# Patient Record
Sex: Female | Born: 2000
Health system: Southern US, Community
[De-identification: ages and names within clinical notes are randomized; demographics above are authoritative.]

## PROBLEM LIST (undated history)

## (undated) DIAGNOSIS — D680 Von Willebrand's disease: Secondary | ICD-10-CM

## (undated) DIAGNOSIS — D6801 Von willebrand disease, type 1: Secondary | ICD-10-CM

## (undated) DIAGNOSIS — R197 Diarrhea, unspecified: Secondary | ICD-10-CM

## (undated) HISTORY — PX: SKIN TAG REMOVAL: SHX780

## (undated) HISTORY — DX: Diarrhea, unspecified: R19.7

## (undated) HISTORY — PX: WISDOM TOOTH EXTRACTION: SHX21

## (undated) HISTORY — DX: Von willebrand disease, type 1: D68.01

## (undated) HISTORY — DX: Von Willebrand's disease: D68.0

---
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
## 2022-02-15 ENCOUNTER — Institutional Professional Consult (permissible substitution): Payer: Self-pay | Admitting: Plastic Surgery

## 2022-02-19 ENCOUNTER — Encounter: Payer: Self-pay | Admitting: Plastic Surgery

## 2022-02-19 ENCOUNTER — Ambulatory Visit (INDEPENDENT_AMBULATORY_CARE_PROVIDER_SITE_OTHER): Payer: 59 | Admitting: Plastic Surgery

## 2022-02-19 DIAGNOSIS — G8929 Other chronic pain: Secondary | ICD-10-CM

## 2022-02-19 DIAGNOSIS — M546 Pain in thoracic spine: Secondary | ICD-10-CM

## 2022-02-19 DIAGNOSIS — Z6835 Body mass index (BMI) 35.0-35.9, adult: Secondary | ICD-10-CM

## 2022-02-19 DIAGNOSIS — M545 Low back pain, unspecified: Secondary | ICD-10-CM

## 2022-02-19 DIAGNOSIS — M542 Cervicalgia: Secondary | ICD-10-CM | POA: Diagnosis not present

## 2022-02-19 DIAGNOSIS — M549 Dorsalgia, unspecified: Secondary | ICD-10-CM | POA: Insufficient documentation

## 2022-02-19 DIAGNOSIS — Z803 Family history of malignant neoplasm of breast: Secondary | ICD-10-CM

## 2022-02-19 DIAGNOSIS — N62 Hypertrophy of breast: Secondary | ICD-10-CM | POA: Diagnosis not present

## 2022-02-19 NOTE — Progress Notes (Signed)
Patient ID: Cynthia Gamble, female    DOB: Jul 02, 2000, 21 y.o.   MRN: [id number]   No chief complaint on file.   Mammary Hyperplasia: The patient is a 21 y.o. female with a history of mammary hyperplasia for several years.  She has extremely large breasts causing symptoms that include the following: Back pain in the upper and lower back, including neck pain. She pulls or pins her bra straps to provide better lift and relief of the pressure and pain. She notices relief by holding her breast up manually.  Her shoulder straps cause grooves and pain and pressure that requires padding for relief. Pain medication is sometimes required with motrin and tylenol.  Activities that are hindered by enlarged breasts include: exercise and running.  She has tried supportive clothing as well as fitted bras without improvement.  Her breasts are extremely large and fairly symmetric.  She has hyperpigmentation of the inframammary area on both sides.  The sternal to nipple distance on the right is 33 cm and the left is 35 cm.  The IMF distance is 15 cm.  She is 4 feet 9 inches tall and weighs 162 pounds.  The BMI = 35.1 kg/m.  Preoperative bra size = H cup. She would like to be proportionate. The estimated excess breast tissue to be removed at the time of surgery = 350-375 grams on the left and 350-375 grams on the right.  Mammogram history: none.  Family history of breast cancer:  paternal aunt.  Tobacco use:  none.   The patient expresses the desire to pursue surgical intervention.     Review of Systems  Constitutional:  Positive for activity change. Negative for appetite change.  Eyes: Negative.   Respiratory: Negative.  Negative for chest tightness and shortness of breath.   Cardiovascular: Negative.   Gastrointestinal: Negative.   Endocrine: Negative.   Genitourinary: Negative.   Musculoskeletal:  Positive for back pain and neck pain.  Skin: Negative.   Hematological: Negative.    Psychiatric/Behavioral: Negative.      Past Medical History:  Diagnosis Date   Diarrhea    Von Willebrand disease type IA (HCC)     Past Surgical History:  Procedure Laterality Date   SKIN TAG REMOVAL     WISDOM TOOTH EXTRACTION        Current Outpatient Medications:    Cholecalciferol 25 MCG (1000 UT) tablet, Take by mouth., Disp: , Rfl:    JUNEL FE 24 1-20 MG-MCG(24) tablet, Take 1 tablet by mouth daily., Disp: , Rfl:    Objective:   Vitals:   02/19/22 1027  BP: 106/66  Pulse: 86  SpO2: 99%    Physical Exam Vitals and nursing note reviewed.  Constitutional:      Appearance: Normal appearance.  HENT:     Head: Normocephalic and atraumatic.  Cardiovascular:     Rate and Rhythm: Normal rate.     Pulses: Normal pulses.  Pulmonary:     Effort: Pulmonary effort is normal.  Abdominal:     General: There is no distension.     Palpations: Abdomen is soft.     Tenderness: There is no abdominal tenderness.  Musculoskeletal:        General: No swelling or deformity.  Skin:    General: Skin is warm.     Capillary Refill: Capillary refill takes less than 2 seconds.     Coloration: Skin is not jaundiced.     Findings: No bruising or lesion.  Neurological:     Mental Status: She is alert and oriented to person, place, and time.  Psychiatric:        Mood and Affect: Mood normal.        Behavior: Behavior normal.        Thought Content: Thought content normal.        Judgment: Judgment normal.     Assessment & Plan:  Symptomatic mammary hypertrophy  Chronic bilateral thoracic back pain  Neck pain  The procedure the patient selected and that was best for the patient was discussed. The risk were discussed and include but not limited to the following:  Breast asymmetry, fluid accumulation, firmness of the breast, inability to breast feed, loss of nipple or areola, skin loss, change in skin and nipple sensation, fat necrosis of the breast tissue, bleeding, infection  and healing delay.  There are risks of anesthesia and injury to nerves or blood vessels.  Allergic reaction to tape, suture and skin glue are possible.  There will be swelling.  Any of these can lead to the need for revisional surgery.  A breast reduction has potential to interfere with diagnostic procedures in the future.  This procedure is best done when the breast is fully developed.  Changes in the breast will continue to occur over time: pregnancy, weight gain or weigh loss.    Total time: 40 minutes. This includes time spent with the patient during the visit as well as time spent before and after the visit reviewing the chart, documenting the encounter, ordering pertinent studies and literature for the patient.    The patient is a good candidate for bilateral breast reduction.  I will go ahead and submit the request for preauthorization.  Pictures were obtained of the patient and placed in the chart with the patient's or guardian's permission.  Alena Bills Shady Padron, DO

## 2022-03-18 ENCOUNTER — Telehealth: Payer: Self-pay | Admitting: *Deleted

## 2022-03-18 NOTE — Telephone Encounter (Signed)
Called patient to schedule surgery, she was in class and will call me back.

## 2022-06-03 NOTE — Progress Notes (Unsigned)
Patient ID: Cynthia Gamble, female    DOB: 02/11/2001, 21 y.o.   MRN: [id number]  No chief complaint on file.     ICD-10-CM   1. Symptomatic mammary hypertrophy  N62     2. Chronic bilateral thoracic back pain  M54.6    G89.29     3. Neck pain  M54.2        History of Present Illness: Cynthia Gamble is a 21 y.o.  female  with a history of macromastia.  She presents for preoperative evaluation for upcoming procedure, Bilateral Breast Reduction, scheduled for 06/12/2022 with Dr.  Ulice Bold  The patient {HAS HAS [id number]} had problems with anesthesia. ***  Summary of Previous Visit: STN is 33 cm on the right and 35 cm on the left.  BMI is 35.  Preoperative bra size equals H cup.  No mammographic history.  No tobacco use.  Estimated excess breast tissue to be removed at time of surgery: 350-375 grams  Job: ***  PMH Significant for: ***   Past Medical History: Allergies: No Known Allergies  Current Medications:  Current Outpatient Medications:    Cholecalciferol 25 MCG (1000 UT) tablet, Take by mouth., Disp: , Rfl:    JUNEL FE 24 1-20 MG-MCG(24) tablet, Take 1 tablet by mouth daily., Disp: , Rfl:   Past Medical Problems: Past Medical History:  Diagnosis Date   Diarrhea    Von Willebrand disease type IA (HCC)     Past Surgical History: Past Surgical History:  Procedure Laterality Date   SKIN TAG REMOVAL     WISDOM TOOTH EXTRACTION      Social History: Social History   Socioeconomic History   Marital status: Single    Spouse name: Not on file   Number of children: Not on file   Years of education: Not on file   Highest education level: Not on file  Occupational History   Not on file  Tobacco Use   Smoking status: Never   Smokeless tobacco: Never  Substance and Sexual Activity   Alcohol use: Not on file   Drug use: Not on file   Sexual activity: Not on file  Other Topics Concern   Not on file  Social History Narrative   Zell is  in the 10th grade at [employer]   Lives with mom, stepdad, 2 brothers.   Social Determinants of Health   Financial Resource Strain: Not on file  Food Insecurity: Not on file  Transportation Needs: Not on file  Physical Activity: Not on file  Stress: Not on file  Social Connections: Not on file  Intimate Partner Violence: Not on file    Family History: Family History  Problem Relation Age of Onset   Irritable bowel syndrome Maternal Grandmother    Lactose intolerance Maternal Grandmother    Hypertension Maternal Grandmother    Short stature Maternal Grandmother        4'10   Migraines Maternal Grandmother    Depression Maternal Grandmother    Anxiety disorder Maternal Grandmother    Cancer Paternal Grandmother    Migraines Mother    Depression Mother    Anxiety disorder Mother    Bipolar disorder Mother    Short stature Maternal Aunt        great aunt- 4'9"   Migraines Maternal Aunt    Depression Maternal Aunt    Anxiety disorder Maternal Aunt    Autism Cousin    Seizures Neg Hx    Schizophrenia Neg  Hx    ADD / ADHD Neg Hx     Review of Systems: ROS  Physical Exam: Vital Signs There were no vitals taken for this visit.  Physical Exam *** Constitutional:      General: Not in acute distress.    Appearance: Normal appearance. Not ill-appearing.  HENT:     Head: Normocephalic and atraumatic.  Eyes:     Pupils: Pupils are equal, round Neck:     Musculoskeletal: Normal range of motion.  Cardiovascular:     Rate and Rhythm: Normal rate    Pulses: Normal pulses.  Pulmonary:     Effort: Pulmonary effort is normal. No respiratory distress.  Musculoskeletal: Normal range of motion.  Skin:    General: Skin is warm and dry.     Findings: No erythema or rash.  Neurological:     General: No focal deficit present.     Mental Status: Alert and oriented to person, place, and time. Mental status is at baseline.     Motor: No weakness.  Psychiatric:        Mood  and Affect: Mood normal.        Behavior: Behavior normal.    Assessment/Plan: The patient is scheduled for bilateral breast reduction with Dr. Ulice Bold.  Risks, benefits, and alternatives of procedure discussed, questions answered and consent obtained.    Smoking Status: ***; Counseling Given? *** Last Mammogram: N/A due to age  Caprini Score: ***; Risk Factors include: ***, BMI *** 25, and length of planned surgery. Recommendation for mechanical *** pharmacological prophylaxis. Encourage early ambulation.   Pictures obtained: @consult   Post-op Rx sent to pharmacy: {Blank:[id number]::"Oxycodone, Zofran, Keflex","Oxycodone, Zofran"}  Patient was provided with the breast reduction and General Surgical Risk consent document and Pain Medication Agreement prior to their appointment.  They had adequate time to read through the risk consent documents and Pain Medication Agreement. We also discussed them in person together during this preop appointment. All of their questions were answered to their satisfaction.  Recommended calling if they have any further questions.  Risk consent form and Pain Medication Agreement to be scanned into patient's chart.  The risk that can be encountered with breast reduction were discussed and include the following but not limited to these:  Breast asymmetry, fluid accumulation, firmness of the breast, inability to breast feed, loss of nipple or areola, skin loss, decrease or no nipple sensation, fat necrosis of the breast tissue, bleeding, infection, healing delay.  There are risks of anesthesia, changes to skin sensation and injury to nerves or blood vessels.  The muscle can be temporarily or permanently injured.  You may have an allergic reaction to tape, suture, glue, blood products which can result in skin discoloration, swelling, pain, skin lesions, poor healing.  Any of these can lead to the need for revisonal surgery or stage procedures.  A reduction has potential to  interfere with diagnostic procedures.  Nipple or breast piercing can increase risks of infection.  This procedure is best done when the breast is fully developed.  Changes in the breast will continue to occur over time.  Pregnancy can alter the outcomes of previous breast reduction surgery, weight gain and weigh loss can also effect the long term appearance.     Electronically signed by: Kermit Balo Abishai Viegas, PA-C 06/03/2022 2:08 PM

## 2022-06-03 NOTE — H&P (View-Only) (Signed)
Patient ID: Cynthia Gamble, female    DOB: 09-23-2000, 21 y.o.   MRN: [id number]  Chief Complaint  Patient presents with   Pre-op Exam      ICD-10-CM   1. Symptomatic mammary hypertrophy  N62     2. Chronic bilateral thoracic back pain  M54.6    G89.29     3. Neck pain  M54.2        History of Present Illness: Cynthia Gamble is a 21 y.o.  female  with a history of macromastia.  She presents for preoperative evaluation for upcoming procedure, Bilateral Breast Reduction, scheduled for 06/12/2022 with Dr.  Ulice Bold. She presents today with her mother. Mom has a hx of breast reduction as well.  The patient has not had problems with anesthesia. No history of DVT/PE.  No family history of DVT/PE.  No family or personal history of bleeding or clotting disorders.  Patient is not currently taking any blood thinners.  No history of CVA/MI.   Summary of Previous Visit: STN is 33 cm on the right and 35 cm on the left.  BMI is 35.  Preoperative bra size equals H cup.  No mammographic history.  No tobacco use.  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  PMH Significant for: Patient is on OCP. No other medical hx  Addendum: Patient with concerns for VWD 1A, saw hematology for consult August 2023. Patient had repeat labs earlier this year (August). Hematologist, Dr. Trinna Balloon at [facility], "I see no evidence of a primary or secondary hemostatic disorder based on the work-up done today. I see no hematologic contraindication to proceeding with her elective surgery."  She would like to be a C cup, if possible  Past Medical History: Allergies: No Known Allergies  Current Medications:  Current Outpatient Medications:    Cholecalciferol 25 MCG (1000 UT) tablet, Take by mouth., Disp: , Rfl:    JUNEL FE 24 1-20 MG-MCG(24)  tablet, Take 1 tablet by mouth daily., Disp: , Rfl:   Past Medical Problems: Past Medical History:  Diagnosis Date   Diarrhea    Von Willebrand disease type IA (HCC)     Past Surgical History: Past Surgical History:  Procedure Laterality Date   SKIN TAG REMOVAL     WISDOM TOOTH EXTRACTION      Social History: Social History   Socioeconomic History   Marital status: Single    Spouse name: Not on file   Number of children: Not on file   Years of education: Not on file   Highest education level: Not on file  Occupational History   Not on file  Tobacco Use   Smoking status: Never   Smokeless tobacco: Never  Substance and Sexual Activity   Alcohol use: Not on file   Drug use: Not on file   Sexual activity: Not on file  Other Topics Concern   Not on file  Social History Narrative   Cynthia Gamble is in the 10th grade at [employer]   Lives with mom, stepdad, 2 brothers.   Social Determinants of Health   Financial Resource Strain: Not on file  Food Insecurity: Not on file  Transportation Needs: Not on file  Physical Activity: Not on file  Stress: Not on file  Social Connections: Not on file  Intimate Partner Violence: Not on  file    Family History: Family History  Problem Relation Age of Onset   Irritable bowel syndrome Maternal Grandmother    Lactose intolerance Maternal Grandmother    Hypertension Maternal Grandmother    Short stature Maternal Grandmother        4'10   Migraines Maternal Grandmother    Depression Maternal Grandmother    Anxiety disorder Maternal Grandmother    Cancer Paternal Grandmother    Migraines Mother    Depression Mother    Anxiety disorder Mother    Bipolar disorder Mother    Short stature Maternal Aunt        great aunt- 4'9"   Migraines Maternal Aunt    Depression Maternal Aunt    Anxiety disorder Maternal Aunt    Autism Cousin    Seizures Neg Hx    Schizophrenia Neg Hx    ADD / ADHD Neg Hx     Review of Systems: Review of  Systems  Constitutional: Negative.   Respiratory: Negative.    Cardiovascular: Negative.   Gastrointestinal: Negative.   Neurological: Negative.     Physical Exam: Vital Signs BP 110/69 (BP Location: Left Arm, Patient Position: Sitting, Cuff Size: Large)   Pulse 83   Ht 4' 8.5" (1.435 m)   Wt 157 lb 9.6 oz (71.5 kg)   SpO2 100%   BMI 34.71 kg/m   Physical Exam Constitutional:      General: Not in acute distress.    Appearance: Normal appearance. Not ill-appearing.  HENT:     Head: Normocephalic and atraumatic.  Eyes:     Pupils: Pupils are equal, round Neck:     Musculoskeletal: Normal range of motion.  Cardiovascular:     Rate and Rhythm: Normal rate    Pulses: Normal pulses.  Pulmonary:     Effort: Pulmonary effort is normal. No respiratory distress.  Musculoskeletal: Normal range of motion.  Skin:    General: Skin is warm and dry.     Findings: No erythema or rash.  Neurological:     General: No focal deficit present.     Mental Status: Alert and oriented to person, place, and time. Mental status is at baseline.     Motor: No weakness.  Psychiatric:        Mood and Affect: Mood normal.        Behavior: Behavior normal.    Assessment/Plan: The patient is scheduled for bilateral breast reduction with Dr. Ulice Bold.  Risks, benefits, and alternatives of procedure discussed, questions answered and consent obtained.    Smoking Status: non smoker; Counseling Given? N/a Last Mammogram: N/A due to age  Caprini Score: 4 moderate; Risk Factors include: on OCP, BMI > 25, and length of planned surgery. Recommendation for mechanical prophylaxis. Encourage early ambulation.   Pictures obtained: @consult   Post-op Rx sent to pharmacy: Oxycodone, Zofran, Keflex  Patient was provided with the breast reduction and General Surgical Risk consent document and Pain Medication Agreement prior to their appointment.  They had adequate time to read through the risk consent  documents and Pain Medication Agreement. We also discussed them in person together during this preop appointment. All of their questions were answered to their satisfaction.  Recommended calling if they have any further questions.  Risk consent form and Pain Medication Agreement to be scanned into patient's chart.  The risk that can be encountered with breast reduction were discussed and include the following but not limited to these:  Breast asymmetry, fluid accumulation, firmness of  the breast, inability to breast feed, loss of nipple or areola, skin loss, decrease or no nipple sensation, fat necrosis of the breast tissue, bleeding, infection, healing delay.  There are risks of anesthesia, changes to skin sensation and injury to nerves or blood vessels.  The muscle can be temporarily or permanently injured.  You may have an allergic reaction to tape, suture, glue, blood products which can result in skin discoloration, swelling, pain, skin lesions, poor healing.  Any of these can lead to the need for revisonal surgery or stage procedures.  A reduction has potential to interfere with diagnostic procedures.  Nipple or breast piercing can increase risks of infection.  This procedure is best done when the breast is fully developed.  Changes in the breast will continue to occur over time.  Pregnancy can alter the outcomes of previous breast reduction surgery, weight gain and weigh loss can also effect the long term appearance.   Discussed with patient and mother that a C cup would be a difficult goal due to the current size and density of her breasts. We discussed that we would go as small as safely possible. We discussed bra size is also dependent on where she shops/purchases her bras. They were understanding of this and the reason we would be unable to go very small.  Electronically signed by: Kermit Balo Chapin Arduini, PA-C 06/04/2022 8:37 AM

## 2022-06-04 ENCOUNTER — Ambulatory Visit (INDEPENDENT_AMBULATORY_CARE_PROVIDER_SITE_OTHER): Payer: 59 | Admitting: Surgical

## 2022-06-04 VITALS — BP 110/69 | HR 83 | Ht <= 58 in | Wt 157.6 lb

## 2022-06-04 DIAGNOSIS — N62 Hypertrophy of breast: Secondary | ICD-10-CM

## 2022-06-04 DIAGNOSIS — G8929 Other chronic pain: Secondary | ICD-10-CM

## 2022-06-04 DIAGNOSIS — M546 Pain in thoracic spine: Secondary | ICD-10-CM

## 2022-06-04 DIAGNOSIS — M542 Cervicalgia: Secondary | ICD-10-CM

## 2022-06-04 MED ORDER — CEPHALEXIN 500 MG PO CAPS: 500.0000 mg | ORAL_CAPSULE | Freq: Four times a day (QID) | ORAL | 0 refills | Status: AC

## 2022-06-04 MED ORDER — OXYCODONE HCL 5 MG PO TABS: 5.0000 mg | ORAL_TABLET | Freq: Four times a day (QID) | ORAL | 0 refills | Status: DC | PRN

## 2022-06-04 MED ORDER — ONDANSETRON HCL 4 MG PO TABS: 4.0000 mg | ORAL_TABLET | Freq: Three times a day (TID) | ORAL | 0 refills | Status: DC | PRN

## 2022-06-05 ENCOUNTER — Other Ambulatory Visit: Payer: Self-pay

## 2022-06-05 ENCOUNTER — Encounter (HOSPITAL_BASED_OUTPATIENT_CLINIC_OR_DEPARTMENT_OTHER): Payer: Self-pay | Admitting: Plastic Surgery

## 2022-06-05 ENCOUNTER — Encounter: Payer: Self-pay | Admitting: Surgical

## 2022-06-05 NOTE — Progress Notes (Signed)
   06/05/22 0911  PAT Phone Screen  Is the patient taking a GLP-1 receptor agonist? No  Do You Have Diabetes? No  Do You Have Hypertension? No  Have You Ever Been to the ER for Asthma? No  Have You Taken Oral Steroids in the Past 3 Months? No  Do you Take Phenteramine or any Other Diet Drugs? No  Recent  Lab Work, EKG, CXR? Yes  Where was this test performed? (S)  02/07/22 CBC done during work up w/ hematology h&h 11.2 & 34.2, other significant result Factor VIII assay elevated at 312  Do you have a history of heart problems? No  Any Recent Hospitalizations? No  Height 4' 8.5" (1.435 m)  Weight 71.5 kg  Pat Appointment Scheduled No  Reason for No Appointment Not Needed   Pt has diagnosis of Von Willebrand disease. Seen by hemology at [facility] for work up prior to breast reduction- Clearance in CE note 02/07/22 and also placed on chart.

## 2022-06-06 ENCOUNTER — Other Ambulatory Visit: Payer: Self-pay | Admitting: Physician Assistant

## 2022-06-06 MED ORDER — OXYCODONE HCL 5 MG PO TABS: 5.0000 mg | ORAL_TABLET | ORAL | 0 refills | Status: AC | PRN

## 2022-06-11 ENCOUNTER — Telehealth: Payer: Self-pay | Admitting: *Deleted

## 2022-06-11 NOTE — Telephone Encounter (Signed)
[id number] valid 06/07/22 - 09/05/22 for 10272 2 units

## 2022-06-12 ENCOUNTER — Encounter (HOSPITAL_BASED_OUTPATIENT_CLINIC_OR_DEPARTMENT_OTHER)
Admission: RE | Disposition: A | Discharge status: Home / Self Care | Payer: Self-pay | Source: Home / Self Care | Attending: Plastic Surgery

## 2022-06-12 ENCOUNTER — Encounter (HOSPITAL_BASED_OUTPATIENT_CLINIC_OR_DEPARTMENT_OTHER): Payer: Self-pay | Admitting: Plastic Surgery

## 2022-06-12 ENCOUNTER — Other Ambulatory Visit: Payer: Self-pay

## 2022-06-12 ENCOUNTER — Encounter: Payer: Self-pay | Admitting: Plastic Surgery

## 2022-06-12 ENCOUNTER — Ambulatory Visit (HOSPITAL_BASED_OUTPATIENT_CLINIC_OR_DEPARTMENT_OTHER): Payer: 59 | Admitting: Anesthesiology

## 2022-06-12 ENCOUNTER — Ambulatory Visit (HOSPITAL_BASED_OUTPATIENT_CLINIC_OR_DEPARTMENT_OTHER)
Admission: RE | Admit: 2022-06-12 | Discharge: 2022-06-12 | Disposition: A | Discharge status: Home / Self Care | Payer: 59 | Attending: Plastic Surgery | Admitting: Plastic Surgery

## 2022-06-12 DIAGNOSIS — G8929 Other chronic pain: Secondary | ICD-10-CM | POA: Diagnosis not present

## 2022-06-12 DIAGNOSIS — M542 Cervicalgia: Secondary | ICD-10-CM | POA: Diagnosis not present

## 2022-06-12 DIAGNOSIS — M549 Dorsalgia, unspecified: Secondary | ICD-10-CM | POA: Insufficient documentation

## 2022-06-12 DIAGNOSIS — N62 Hypertrophy of breast: Secondary | ICD-10-CM

## 2022-06-12 DIAGNOSIS — M546 Pain in thoracic spine: Secondary | ICD-10-CM | POA: Insufficient documentation

## 2022-06-12 DIAGNOSIS — Z01818 Encounter for other preprocedural examination: Secondary | ICD-10-CM

## 2022-06-12 HISTORY — PX: BREAST REDUCTION SURGERY: SHX8

## 2022-06-12 LAB — POCT PREGNANCY, URINE: Preg Test, Ur: NEGATIVE

## 2022-06-12 SURGERY — BREAST REDUCTION WITH LIPOSUCTION
Anesthesia: General | Site: Breast | Laterality: Bilateral

## 2022-06-12 MED ORDER — HYDROMORPHONE HCL 1 MG/ML IJ SOLN
INTRAMUSCULAR | Status: AC
  Filled 2022-06-12: qty 1

## 2022-06-12 MED ORDER — OXYCODONE HCL 5 MG PO TABS: 5.0000 mg | ORAL_TABLET | Freq: Once | ORAL | Status: DC | PRN

## 2022-06-12 MED ORDER — ONDANSETRON HCL 4 MG/2ML IJ SOLN
INTRAMUSCULAR | Status: DC | PRN
  Administered 2022-06-12: 4 mg via INTRAVENOUS

## 2022-06-12 MED ORDER — FENTANYL CITRATE (PF) 100 MCG/2ML IJ SOLN: 25.0000 ug | INTRAMUSCULAR | Status: DC | PRN

## 2022-06-12 MED ORDER — LIDOCAINE HCL (CARDIAC) PF 100 MG/5ML IV SOSY
PREFILLED_SYRINGE | INTRAVENOUS | Status: DC | PRN
  Administered 2022-06-12: 100 mg via INTRAVENOUS

## 2022-06-12 MED ORDER — LIDOCAINE HCL 1 % IJ SOLN
INTRAVENOUS | Status: DC | PRN
  Administered 2022-06-12: 1000 mL

## 2022-06-12 MED ORDER — ACETAMINOPHEN 500 MG PO TABS
1000.0000 mg | ORAL_TABLET | Freq: Once | ORAL | Status: AC
  Administered 2022-06-12: 1000 mg via ORAL

## 2022-06-12 MED ORDER — CEFAZOLIN SODIUM-DEXTROSE 2-4 GM/100ML-% IV SOLN
2.0000 g | INTRAVENOUS | Status: AC
  Administered 2022-06-12: 2 g via INTRAVENOUS

## 2022-06-12 MED ORDER — ONDANSETRON HCL 4 MG/2ML IJ SOLN
INTRAMUSCULAR | Status: AC
  Filled 2022-06-12: qty 2

## 2022-06-12 MED ORDER — ACETAMINOPHEN 325 MG PO TABS: 650.0000 mg | ORAL_TABLET | ORAL | Status: DC | PRN

## 2022-06-12 MED ORDER — FENTANYL CITRATE (PF) 100 MCG/2ML IJ SOLN
INTRAMUSCULAR | Status: AC
  Filled 2022-06-12: qty 2

## 2022-06-12 MED ORDER — DEXAMETHASONE SODIUM PHOSPHATE 10 MG/ML IJ SOLN
INTRAMUSCULAR | Status: AC
  Filled 2022-06-12: qty 1

## 2022-06-12 MED ORDER — MIDAZOLAM HCL 5 MG/5ML IJ SOLN
INTRAMUSCULAR | Status: DC | PRN
  Administered 2022-06-12: 2 mg via INTRAVENOUS

## 2022-06-12 MED ORDER — EPHEDRINE 5 MG/ML INJ
INTRAVENOUS | Status: AC
  Filled 2022-06-12: qty 5

## 2022-06-12 MED ORDER — LIDOCAINE-EPINEPHRINE 1 %-1:100000 IJ SOLN
INTRAMUSCULAR | Status: DC | PRN
  Administered 2022-06-12: 50 mL via INTRAMUSCULAR

## 2022-06-12 MED ORDER — SODIUM CHLORIDE 0.9% FLUSH: 3.0000 mL | Freq: Two times a day (BID) | INTRAVENOUS | Status: DC

## 2022-06-12 MED ORDER — LIDOCAINE 2% (20 MG/ML) 5 ML SYRINGE
INTRAMUSCULAR | Status: AC
  Filled 2022-06-12: qty 5

## 2022-06-12 MED ORDER — OXYCODONE HCL 5 MG/5ML PO SOLN: 5.0000 mg | Freq: Once | ORAL | Status: DC | PRN

## 2022-06-12 MED ORDER — CEFAZOLIN SODIUM-DEXTROSE 2-4 GM/100ML-% IV SOLN
INTRAVENOUS | Status: AC
  Filled 2022-06-12: qty 100

## 2022-06-12 MED ORDER — ACETAMINOPHEN 500 MG PO TABS
ORAL_TABLET | ORAL | Status: AC
  Filled 2022-06-12: qty 2

## 2022-06-12 MED ORDER — FENTANYL CITRATE (PF) 100 MCG/2ML IJ SOLN
INTRAMUSCULAR | Status: DC | PRN
  Administered 2022-06-12: 25 ug via INTRAVENOUS
  Administered 2022-06-12: 50 ug via INTRAVENOUS
  Administered 2022-06-12: 25 ug via INTRAVENOUS
  Administered 2022-06-12: 100 ug via INTRAVENOUS

## 2022-06-12 MED ORDER — SODIUM CHLORIDE 0.9% FLUSH: 3.0000 mL | INTRAVENOUS | Status: DC | PRN

## 2022-06-12 MED ORDER — CHLORHEXIDINE GLUCONATE CLOTH 2 % EX PADS: 6.0000 | MEDICATED_PAD | Freq: Once | CUTANEOUS | Status: DC

## 2022-06-12 MED ORDER — PROMETHAZINE HCL 25 MG/ML IJ SOLN: 6.2500 mg | INTRAMUSCULAR | Status: DC | PRN

## 2022-06-12 MED ORDER — SUGAMMADEX SODIUM 200 MG/2ML IV SOLN
INTRAVENOUS | Status: DC | PRN
  Administered 2022-06-12: 150 mg via INTRAVENOUS
  Administered 2022-06-12: 10920 mg via INTRAVENOUS

## 2022-06-12 MED ORDER — KETOROLAC TROMETHAMINE 30 MG/ML IJ SOLN
INTRAMUSCULAR | Status: AC
  Filled 2022-06-12: qty 1

## 2022-06-12 MED ORDER — ROCURONIUM BROMIDE 100 MG/10ML IV SOLN
INTRAVENOUS | Status: DC | PRN
  Administered 2022-06-12: 40 mg via INTRAVENOUS

## 2022-06-12 MED ORDER — HYDROMORPHONE HCL 1 MG/ML IJ SOLN
INTRAMUSCULAR | Status: DC | PRN
  Administered 2022-06-12 (×2): .5 mg via INTRAVENOUS

## 2022-06-12 MED ORDER — PROPOFOL 10 MG/ML IV BOLUS
INTRAVENOUS | Status: DC | PRN
  Administered 2022-06-12: 200 mg via INTRAVENOUS

## 2022-06-12 MED ORDER — LACTATED RINGERS IV SOLN: INTRAVENOUS | Status: DC

## 2022-06-12 MED ORDER — SUCCINYLCHOLINE CHLORIDE 200 MG/10ML IV SOSY
PREFILLED_SYRINGE | INTRAVENOUS | Status: AC
  Filled 2022-06-12: qty 10

## 2022-06-12 MED ORDER — SCOPOLAMINE 1 MG/3DAYS TD PT72
MEDICATED_PATCH | TRANSDERMAL | Status: AC
  Filled 2022-06-12: qty 1

## 2022-06-12 MED ORDER — 0.9 % SODIUM CHLORIDE (POUR BTL) OPTIME
TOPICAL | Status: DC | PRN
  Administered 2022-06-12: 1000 mL

## 2022-06-12 MED ORDER — SCOPOLAMINE 1 MG/3DAYS TD PT72
1.0000 | MEDICATED_PATCH | TRANSDERMAL | Status: DC
  Administered 2022-06-12: 1.5 mg via TRANSDERMAL

## 2022-06-12 MED ORDER — ACETAMINOPHEN 325 MG RE SUPP: 650.0000 mg | RECTAL | Status: DC | PRN

## 2022-06-12 MED ORDER — OXYCODONE HCL 5 MG PO TABS: 5.0000 mg | ORAL_TABLET | ORAL | Status: DC | PRN

## 2022-06-12 MED ORDER — PHENYLEPHRINE 80 MCG/ML (10ML) SYRINGE FOR IV PUSH (FOR BLOOD PRESSURE SUPPORT)
PREFILLED_SYRINGE | INTRAVENOUS | Status: AC
  Filled 2022-06-12: qty 10

## 2022-06-12 MED ORDER — ATROPINE SULFATE 0.4 MG/ML IV SOLN
INTRAVENOUS | Status: AC
  Filled 2022-06-12: qty 1

## 2022-06-12 MED ORDER — SODIUM CHLORIDE 0.9 % IV SOLN: 250.0000 mL | INTRAVENOUS | Status: DC | PRN

## 2022-06-12 MED ORDER — DEXAMETHASONE SODIUM PHOSPHATE 4 MG/ML IJ SOLN
INTRAMUSCULAR | Status: DC | PRN
  Administered 2022-06-12: 5 mg via INTRAVENOUS

## 2022-06-12 MED ORDER — KETOROLAC TROMETHAMINE 30 MG/ML IJ SOLN
30.0000 mg | Freq: Once | INTRAMUSCULAR | Status: AC | PRN
  Administered 2022-06-12: 30 mg via INTRAVENOUS

## 2022-06-12 MED ORDER — MIDAZOLAM HCL 2 MG/2ML IJ SOLN
INTRAMUSCULAR | Status: AC
  Filled 2022-06-12: qty 2

## 2022-06-12 SURGICAL SUPPLY — 62 items
ADH SKN CLS APL DERMABOND .7 (GAUZE/BANDAGES/DRESSINGS) ×2
BAG DECANTER FOR FLEXI CONT (MISCELLANEOUS) ×1 IMPLANT
BINDER BREAST LRG (GAUZE/BANDAGES/DRESSINGS) IMPLANT
BINDER BREAST MEDIUM (GAUZE/BANDAGES/DRESSINGS) IMPLANT
BINDER BREAST XLRG (GAUZE/BANDAGES/DRESSINGS) IMPLANT
BINDER BREAST XXLRG (GAUZE/BANDAGES/DRESSINGS) IMPLANT
BIOPATCH RED 1 DISK 7.0 (GAUZE/BANDAGES/DRESSINGS) IMPLANT
BLADE HEX COATED 2.75 (ELECTRODE) IMPLANT
BLADE KNIFE PERSONA 10 (BLADE) ×2 IMPLANT
BLADE SURG 15 STRL LF DISP TIS (BLADE) ×1 IMPLANT
BLADE SURG 15 STRL SS (BLADE) ×1
CANISTER SUCT 1200ML W/VALVE (MISCELLANEOUS) ×1 IMPLANT
COVER BACK TABLE 60X90IN (DRAPES) ×1 IMPLANT
COVER MAYO STAND STRL (DRAPES) ×1 IMPLANT
DERMABOND ADVANCED .7 DNX12 (GAUZE/BANDAGES/DRESSINGS) ×2 IMPLANT
DRAIN CHANNEL 19F RND (DRAIN) IMPLANT
DRAPE LAPAROSCOPIC ABDOMINAL (DRAPES) ×1 IMPLANT
DRSG MEPILEX POST OP 4X8 (GAUZE/BANDAGES/DRESSINGS) ×2 IMPLANT
ELECT BLADE 4.0 EZ CLEAN MEGAD (MISCELLANEOUS) ×1
ELECT REM PT RETURN 9FT ADLT (ELECTROSURGICAL) ×1
ELECTRODE BLDE 4.0 EZ CLN MEGD (MISCELLANEOUS) ×1 IMPLANT
ELECTRODE REM PT RTRN 9FT ADLT (ELECTROSURGICAL) ×1 IMPLANT
EVACUATOR SILICONE 100CC (DRAIN) IMPLANT
GAUZE PAD ABD 8X10 STRL (GAUZE/BANDAGES/DRESSINGS) ×2 IMPLANT
GLOVE BIO SURGEON STRL SZ 6.5 (GLOVE) ×3 IMPLANT
GLOVE BIO SURGEON STRL SZ7.5 (GLOVE) ×1 IMPLANT
GOWN STRL REUS W/ TWL LRG LVL3 (GOWN DISPOSABLE) ×2 IMPLANT
GOWN STRL REUS W/ TWL XL LVL3 (GOWN DISPOSABLE) ×1 IMPLANT
GOWN STRL REUS W/TWL LRG LVL3 (GOWN DISPOSABLE) ×2
GOWN STRL REUS W/TWL XL LVL3 (GOWN DISPOSABLE) ×1
NDL FILTER BLUNT 18X1 1/2 (NEEDLE) IMPLANT
NDL HYPO 25X1 1.5 SAFETY (NEEDLE) ×1 IMPLANT
NEEDLE FILTER BLUNT 18X1 1/2 (NEEDLE) IMPLANT
NEEDLE HYPO 25X1 1.5 SAFETY (NEEDLE) ×1 IMPLANT
NS IRRIG 1000ML POUR BTL (IV SOLUTION) ×1 IMPLANT
PACK BASIN DAY SURGERY FS (CUSTOM PROCEDURE TRAY) ×1 IMPLANT
PAD ALCOHOL SWAB (MISCELLANEOUS) IMPLANT
PAD FOAM SILICONE BACKED (GAUZE/BANDAGES/DRESSINGS) IMPLANT
PENCIL SMOKE EVACUATOR (MISCELLANEOUS) ×1 IMPLANT
PIN SAFETY STERILE (MISCELLANEOUS) IMPLANT
SLEEVE SCD COMPRESS KNEE MED (STOCKING) ×1 IMPLANT
SPIKE FLUID TRANSFER (MISCELLANEOUS) IMPLANT
SPONGE T-LAP 18X18 ~~LOC~~+RFID (SPONGE) ×2 IMPLANT
STRIP SUTURE WOUND CLOSURE 1/2 (MISCELLANEOUS) ×2 IMPLANT
SUT MNCRL AB 4-0 PS2 18 (SUTURE) ×4 IMPLANT
SUT MON AB 3-0 SH 27 (SUTURE) ×7
SUT MON AB 3-0 SH27 (SUTURE) ×4 IMPLANT
SUT MON AB 5-0 PS2 18 (SUTURE) IMPLANT
SUT PDS 3-0 CT2 (SUTURE) ×6
SUT PDS II 3-0 CT2 27 ABS (SUTURE) ×4 IMPLANT
SUT SILK 3 0 PS 1 (SUTURE) IMPLANT
SYR 50ML LL SCALE MARK (SYRINGE) IMPLANT
SYR BULB IRRIG 60ML STRL (SYRINGE) ×1 IMPLANT
SYR CONTROL 10ML LL (SYRINGE) ×1 IMPLANT
TAPE MEASURE VINYL STERILE (MISCELLANEOUS) IMPLANT
TOWEL GREEN STERILE FF (TOWEL DISPOSABLE) ×2 IMPLANT
TRAY DSU PREP LF (CUSTOM PROCEDURE TRAY) ×1 IMPLANT
TUBE CONNECTING 20X1/4 (TUBING) ×1 IMPLANT
TUBING INFILTRATION IT-10001 (TUBING) IMPLANT
TUBING SET GRADUATE ASPIR 12FT (MISCELLANEOUS) IMPLANT
UNDERPAD 30X36 HEAVY ABSORB (UNDERPADS AND DIAPERS) ×2 IMPLANT
YANKAUER SUCT BULB TIP NO VENT (SUCTIONS) ×1 IMPLANT

## 2022-06-12 NOTE — Anesthesia Procedure Notes (Signed)
Procedure Name: Intubation Date/Time: 06/12/2022 11:25 AM  Performed by: Willa Frater, CRNAPre-anesthesia Checklist: Patient identified, Emergency Drugs available, Suction available and Patient being monitored Patient Re-evaluated:Patient Re-evaluated prior to induction Oxygen Delivery Method: Circle system utilized Preoxygenation: Pre-oxygenation with 100% oxygen Induction Type: IV induction Ventilation: Mask ventilation without difficulty Laryngoscope Size: Mac and 3 Grade View: Grade I Tube type: Oral Tube size: 7.0 mm Number of attempts: 1 Airway Equipment and Method: Stylet and Oral airway Placement Confirmation: ETT inserted through vocal cords under direct vision, positive ETCO2 and breath sounds checked- equal and bilateral Secured at: 21 cm Tube secured with: Tape Dental Injury: Teeth and Oropharynx as per pre-operative assessment

## 2022-06-12 NOTE — Transfer of Care (Signed)
Immediate Anesthesia Transfer of Care Note  Patient: Cynthia Gamble  Procedure(s) Performed: Bilateral breast reduction with liposuction (Bilateral: Breast)  Patient Location: PACU  Anesthesia Type:General  Level of Consciousness: awake and drowsy  Airway & Oxygen Therapy: Patient Spontanous Breathing and Patient connected to face mask oxygen  Post-op Assessment: Report given to RN and Post -op Vital signs reviewed and stable  Post vital signs: Reviewed and stable  Last Vitals:  Vitals Value Taken Time  BP    Temp    Pulse 113 06/12/22 1343  Resp    SpO2 99 % 06/12/22 1343  Vitals shown include unvalidated device data.  Last Pain:  Vitals:   06/12/22 1000  TempSrc: Oral  PainSc: 0-No pain         Complications: No notable events documented.

## 2022-06-12 NOTE — Interval H&P Note (Signed)
History and Physical Interval Note:  06/12/2022 10:42 AM  Cynthia Gamble  has presented today for surgery, with the diagnosis of Symptomatic mammary hypertrophy.  The various methods of treatment have been discussed with the patient and family. After consideration of risks, benefits and other options for treatment, the patient has consented to  Procedure(s): Bilateral breast reduction with possible liposuction (Bilateral) as a surgical intervention.  The patient's history has been reviewed, patient examined, no change in status, stable for surgery.  I have reviewed the patient's chart and labs.  Questions were answered to the patient's satisfaction.     Alena Bills [place] Kuang

## 2022-06-12 NOTE — Discharge Instructions (Addendum)
INSTRUCTIONS FOR AFTER BREAST SURGERY   You will likely have some questions about what to expect following your operation.  The following information will help you and your family understand what to expect when you are discharged from the hospital.  It is important to follow these guidelines to help ensure a smooth recovery and reduce complication.  Postoperative instructions include information on: diet, wound care, medications and physical activity.  AFTER SURGERY Expect to go home after the procedure.  In some cases, you may need to spend one night in the hospital for observation.  DIET Breast surgery does not require a specific diet.  However, the healthier you eat the better your body will heal. It is important to increasing your protein intake.  This means limiting the foods with sugar and carbohydrates.  Focus on vegetables and some meat.  If you have liposuction during your procedure be sure to drink water.  If your urine is bright yellow, then it is concentrated, and you need to drink more water.  As a general rule after surgery, you should have 8 ounces of water every hour while awake.  If you find you are persistently nauseated or unable to take in liquids let us know.  NO TOBACCO USE or EXPOSURE.  This will slow your healing process and lead to a wound.  WOUND CARE Leave the binder on for 3 days . Use fragrance free soap like Dial, Dove or [place].   After 3 days you can remove the binder to shower. Once dry apply binder or sports bra. If you have liposuction you will have a soft and spongy dressing (Lipofoam) that helps prevent creases in your skin.  Remove before you shower and then replace it.  It is also available on [employer]. If you have steri-strips / tape directly attached to your skin leave them in place. It is OK to get these wet.   No baths, pools or hot tubs for four weeks. We close your incision to leave the smallest and best-looking scar. No ointment or creams on your incisions  for four weeks.  No Neosporin (Too many skin reactions).  A few weeks after surgery you can use Mederma and start massaging the scar. We ask you to wear your binder or sports bra for the first 6 weeks around the clock, including while sleeping. This provides added comfort and helps reduce the fluid accumulation at the surgery site. No Ice or heating pads to the operative site.  You have a very high risk of a burn before you feel the temperature change.  ACTIVITY No heavy lifting until cleared by the doctor.  This usually means no more than a half-gallon of milk.  It is OK to walk and climb stairs. Moving your legs is very important to decrease your risk of a blood clot.  It will also help keep you from getting deconditioned.  Every 1 to 2 hours get up and walk for 5 minutes. This will help with a quicker recovery back to normal.  Let pain be your guide so you don't do too much.  This time is for you to recover.  You will be more comfortable if you sleep and rest with your head elevated either with a few pillows under you or in a recliner.  No stomach sleeping for a three months.  WORK Everyone returns to work at different times. As a rough guide, most people take at least 1 - 2 weeks off prior to returning to work. If  you need documentation for your job, give the forms to the front staff at the clinic.  DRIVING Arrange for someone to bring you home from the hospital after your surgery.  You may be able to drive a few days after surgery but not while taking any narcotics or valium.  BOWEL MOVEMENTS Constipation can occur after anesthesia and while taking pain medication.  It is important to stay ahead for your comfort.  We recommend taking Milk of Magnesia (2 tablespoons; twice a day) while taking the pain pills.  MEDICATIONS You may be prescribed should start after surgery At your preoperative visit for you history and physical you may have been given the following medications: An antibiotic: Start  this medication when you get home and take according to the instructions on the bottle. Zofran 4 mg:  This is to treat nausea and vomiting.  You can take this every 6 hours as needed and only if needed. Valium 2 mg for breast cancer patients: This is for muscle tightness if you have an implant or expander. This will help relax your muscle which also helps with pain control.  This can be taken every 12 hours as needed. Don't drive after taking this medication. Norco (hydrocodone/acetaminophen) 5/325 mg:  This is only to be used after you have taken the Motrin or the Tylenol. Every 8 hours as needed.   Over the counter Medication to take: Ibuprofen (Motrin) 600 mg:  Take this every 6 hours.  If you have additional pain then take 500 mg of the Tylenol every 8 hours.  Only take the Norco after you have tried these two. MiraLAX or Milk of Magnesia: Take this according to the bottle if you take the [place] Call your surgeon's office if any of the following occur: Fever 101 degrees F or greater Excessive bleeding or fluid from the incision site. Pain that increases over time without aid from the medications Redness, warmth, or pus draining from incision sites Persistent nausea or inability to take in liquids Severe misshapen area that underwent the operation.  Post Anesthesia Home Care Instructions  Activity: Get plenty of rest for the remainder of the day. A responsible individual must stay with you for 24 hours following the procedure.  For the next 24 hours, DO NOT: -Drive a car -Paediatric nurse -Drink alcoholic beverages -Take any medication unless instructed by your physician -Make any legal decisions or sign important papers.  Meals: Start with liquid foods such as gelatin or soup. Progress to regular foods as tolerated. Avoid greasy, spicy, heavy foods. If nausea and/or vomiting occur, drink only clear liquids until the nausea and/or vomiting subsides. Call your  physician if vomiting continues.  Special Instructions/Symptoms: Your throat may feel dry or sore from the anesthesia or the breathing tube placed in your throat during surgery. If this causes discomfort, gargle with warm salt water. The discomfort should disappear within 24 hours.  If you had a scopolamine patch placed behind your ear for the management of post- operative nausea and/or vomiting:  1. The medication in the patch is effective for 72 hours, after which it should be removed.  Wrap patch in a tissue and discard in the trash. Wash hands thoroughly with soap and water. 2. You may remove the patch earlier than 72 hours if you experience unpleasant side effects which may include dry mouth, dizziness or visual disturbances. 3. Avoid touching the patch. Wash your hands with soap and water after contact with the patch.  Can take tylenol after 4pm

## 2022-06-12 NOTE — Op Note (Signed)
Breast Reduction Op note:    DATE OF PROCEDURE: 06/12/2022  LOCATION: [facility]  SURGEON: Alan Ripper Sanger Cabrini Ruggieri, DO  ASSISTANT: Keenan Bachelor, PA  PREOPERATIVE DIAGNOSIS 1. Macromastia 2. Neck Pain 3. Back Pain  POSTOPERATIVE DIAGNOSIS 1. Macromastia 2. Neck Pain 3. Back Pain  PROCEDURES 1. Bilateral breast reduction.  Right reduction 794 g, Left reduction 791 g  COMPLICATIONS: None.  DRAINS: none  INDICATIONS FOR PROCEDURE Cynthia Gamble is a 21 y.o. year-old female born on 04-23-2001,with a history of symptomatic macromastia with concominant back pain, neck pain, shoulder grooving from her bra.   MRN: [id number]  CONSENT Informed consent was obtained directly from the patient. The risks, benefits and alternatives were fully discussed. Specific risks including but not limited to bleeding, infection, hematoma, seroma, scarring, pain, nipple necrosis, asymmetry, poor cosmetic results, and need for further surgery were discussed. The patient had ample opportunity to have her questions answered to her satisfaction.  DESCRIPTION OF PROCEDURE  Patient was brought into the operating room and placed in a supine position.  SCDs were placed and appropriate padding was performed.  Antibiotics were given. The patient underwent general anesthesia and the chest was prepped and draped in a sterile fashion.  A timeout was performed and all information was confirmed to be correct.  Right side: Preoperative markings were confirmed.  Incision lines were injected with local with epinephrine.  After waiting for vasoconstriction, the marked lines were incised.  A Wise-pattern superomedial breast reduction was performed by de-epithelializing the pedicle, using bovie to create the superomedial pedicle, and removing breast tissue from the latera, and inferior portions of the breast.  Care was taken to not undermine the breast pedicle. Hemostasis was achieved.   The nipple was gently rotated into position and the soft tissue closed with 4-0 Monocryl.   The pocket was irrigated and hemostasis confirmed.  The deep tissues were approximated with 3-0 PDS and 3-0 Monocryl sutures and the skin was closed with deep dermal and subcuticular 4-0 Monocryl sutures.  The nipple and skin flaps had good capillary refill at the end of the procedure.    Left side: Preoperative markings were confirmed.  Incision lines were injected with local with epinephrine.  After waiting for vasoconstriction, the marked lines were incised.  A Wise-pattern superomedial breast reduction was performed by de-epithelializing the pedicle, using bovie to create the superomedial pedicle, and removing breast tissue from the lateral and inferior portions of the breast.  Care was taken to not undermine the breast pedicle. Hemostasis was achieved.  The nipple was gently rotated into position and the soft tissue was closed with 4-0 Monocryl.  The patient was sat upright and size and shape symmetry was confirmed.  The pocket was irrigated and hemostasis confirmed.  The deep tissues were approximated with 3-0 PDS and 3-0 Monocryl sutures and the skin was closed with deep dermal and subcuticular 4-0 Monocryl sutures.  Dermabond was applied.  A breast binder and ABDs were placed.  The nipple and skin flaps had good capillary refill at the end of the procedure.  The patient tolerated the procedure well. The patient was allowed to wake from anesthesia and taken to the recovery room in satisfactory condition.  The advanced practice practitioner (APP) assisted throughout the case.  The APP was essential in retraction and counter traction when needed to make the case progress smoothly.  This retraction and assistance made it possible to see the tissue plans for the procedure.  The assistance was  needed for blood control, tissue re-approximation and assisted with closure of the incision site.

## 2022-06-12 NOTE — Anesthesia Preprocedure Evaluation (Addendum)
Anesthesia Evaluation  Patient identified by MRN, date of birth, ID band Patient awake    Reviewed: Allergy & Precautions, NPO status , Patient's Chart, lab work & pertinent test results  History of Anesthesia Complications Negative for: history of anesthetic complications  Airway Mallampati: II  TM Distance: >3 FB Neck ROM: Full    Dental no notable dental hx.    Pulmonary neg pulmonary ROS   Pulmonary exam normal breath sounds clear to auscultation       Cardiovascular negative cardio ROS  Rhythm:Regular Rate:Normal     Neuro/Psych negative neurological ROS     GI/Hepatic negative GI ROS, Neg liver ROS,,,  Endo/Other  negative endocrine ROS    Renal/GU negative Renal ROS     Musculoskeletal   Abdominal  (+) + obese  Peds  Hematology Easy bruising   Anesthesia Other Findings Patient diagnosed with von Willebrand's type 1a when she was young. Per hematologist, Dr. Jolyne Loa, at her visit on 02/07/2022 after repeat testing, "I see no evidence of a primary or secondary hemostatic disorder based on the work-up done today. I see no hematologic contraindication to proceeding with her elective surgery."  She has had wisdom teeth extraction and a tattoo without excessive bleeding.  Reproductive/Obstetrics                             Anesthesia Physical Anesthesia Plan  ASA: 2  Anesthesia Plan: General   Post-op Pain Management:    Induction: Intravenous  PONV Risk Score and Plan: 3 and Ondansetron, Dexamethasone, Midazolam and Scopolamine patch - Pre-op  Airway Management Planned: Oral ETT  Additional Equipment:   Intra-op Plan:   Post-operative Plan: Extubation in OR  Informed Consent: I have reviewed the patients History and Physical, chart, labs and discussed the procedure including the risks, benefits and alternatives for the proposed anesthesia with the patient or authorized  representative who has indicated his/her understanding and acceptance.     Dental advisory given  Plan Discussed with: CRNA and Anesthesiologist  Anesthesia Plan Comments: (Risks of general anesthesia discussed including, but not limited to, sore throat, hoarse voice, chipped/damaged teeth, injury to vocal cords, nausea and vomiting, allergic reactions, lung infection, heart attack, stroke, and death. All questions answered. )        Anesthesia Quick Evaluation

## 2022-06-12 NOTE — Anesthesia Postprocedure Evaluation (Signed)
Anesthesia Post Note  Patient: Cynthia Gamble  Procedure(s) Performed: Bilateral breast reduction with liposuction (Bilateral: Breast)     Patient location during evaluation: PACU Anesthesia Type: General Level of consciousness: awake Pain management: pain level controlled Vital Signs Assessment: post-procedure vital signs reviewed and stable Respiratory status: spontaneous breathing, nonlabored ventilation and respiratory function stable Cardiovascular status: blood pressure returned to baseline and stable Postop Assessment: no apparent nausea or vomiting Anesthetic complications: no   No notable events documented.  Last Vitals:  Vitals:   06/12/22 1430 06/12/22 1440  BP: 119/81 114/64  Pulse: 72 82  Resp: (!) 21 16  Temp:  37 C  SpO2: 96% 97%    Last Pain:  Vitals:   06/12/22 1440  TempSrc: Oral  PainSc: 0-No pain                 Linton Rump

## 2022-06-13 ENCOUNTER — Encounter (HOSPITAL_BASED_OUTPATIENT_CLINIC_OR_DEPARTMENT_OTHER): Payer: Self-pay | Admitting: Plastic Surgery

## 2022-06-13 ENCOUNTER — Ambulatory Visit (INDEPENDENT_AMBULATORY_CARE_PROVIDER_SITE_OTHER): Payer: 59 | Admitting: Physician Assistant

## 2022-06-13 DIAGNOSIS — N62 Hypertrophy of breast: Secondary | ICD-10-CM

## 2022-06-13 NOTE — Addendum Note (Signed)
Addendum  created 06/13/22 0839 by Ronnette Hila, CRNA   Intraprocedure Meds edited

## 2022-06-13 NOTE — Progress Notes (Signed)
This is a 21 YOF POD #1 SP bilateral breast reduction by Dr. Ulice Bold on 06/12/2022. I connected with her today via phone. I verified identity and location here in [place].  She notes that she has been doing well. Her pain is managed well with prescriptions medications. She denies any fever or infectious signs or symptoms. She denies any drainage. She is wearing her binder, it fits well.   Exam: Via phone  Speaking in full sentences in no acute distress   Overall the patient is doing well. Her pain is managed well at this time and has no significant complaints or concerns. I advised her to reach out to [place] immediately if she has any questions or concerns.

## 2022-06-14 LAB — SURGICAL PATHOLOGY

## 2022-06-21 ENCOUNTER — Ambulatory Visit (INDEPENDENT_AMBULATORY_CARE_PROVIDER_SITE_OTHER): Payer: 59 | Admitting: Plastic Surgery

## 2022-06-21 ENCOUNTER — Encounter: Payer: Self-pay | Admitting: Plastic Surgery

## 2022-06-21 VITALS — BP 117/78 | HR 90

## 2022-06-21 DIAGNOSIS — N62 Hypertrophy of breast: Secondary | ICD-10-CM

## 2022-06-21 MED ORDER — KETOROLAC TROMETHAMINE 10 MG PO TABS: 10.0000 mg | ORAL_TABLET | Freq: Three times a day (TID) | ORAL | 0 refills | Status: AC | PRN

## 2022-06-21 NOTE — Progress Notes (Signed)
Patient is a 21 year old female here with mom for follow-up on her breast reduction.  She had over 790 g removed from each breast.  She has bruising and swelling as expected.  No sign of hematoma or seroma.  She says the pain is pretty bad and she does not like taking any of the narcotic because it makes her hallucinate.  There is no sign of infection.  I would like her to take some MiraLAX as she has not had a bowel movement yet.  I will call in some Toradol for her to the pharmacy.

## 2022-07-02 ENCOUNTER — Ambulatory Visit (INDEPENDENT_AMBULATORY_CARE_PROVIDER_SITE_OTHER): Payer: 59 | Admitting: Surgical

## 2022-07-02 ENCOUNTER — Encounter: Payer: Self-pay | Admitting: Surgical

## 2022-07-02 DIAGNOSIS — N62 Hypertrophy of breast: Secondary | ICD-10-CM

## 2022-07-02 DIAGNOSIS — G8929 Other chronic pain: Secondary | ICD-10-CM

## 2022-07-02 DIAGNOSIS — M546 Pain in thoracic spine: Secondary | ICD-10-CM

## 2022-07-02 DIAGNOSIS — M542 Cervicalgia: Secondary | ICD-10-CM

## 2022-07-02 NOTE — Progress Notes (Signed)
22 year old female here for follow-up after bilateral breast reduction on 06/12/2022 with Dr. Marla Roe.  Patient presents with her mother, reports overall she is doing well, but has some concerns about the right nipple areola incision and pain to bilateral breast that extends into her arms, reports that as chills/cold sensation.  Chaperone present on exam On exam right NAC wound is noted from approximately 10-12 o'clock, appears to be some superficial nipple areolar necrosis with scabbing present.  There is no surrounding erythema or cellulitic changes.  There is no foul odor is noted.  The rest of the right breast incisions are intact and healing well.  The rest of the right NAC is viable as well as the left NAC.  I do not appreciate any subcutaneous fluid collections noted with palpation.  A/P:  Recommend Vaseline and gauze to right NAC wound, discussed with patient this will epithelialize in the next few weeks, the pigmentation will then return over the next few months.  Some of it may remain pink in color.  Patient was understanding.   Discussed with patient the bilateral breast pain at this point is not uncommon, the sensation extending into her arms may be nerve related, recommend continue to monitor.  Recommend continue with compressive garments, which strenuous activities.  She can return to work on light duty, can return to work full duty with no restrictions 08/22/2022.  No signs of infection on exam.  We will plan to take pictures at her next appointment when she follows up in 2 to 3 weeks.  Call with questions or concerns.

## 2022-07-19 ENCOUNTER — Encounter: Payer: 59 | Admitting: Surgical

## 2022-07-26 ENCOUNTER — Ambulatory Visit (INDEPENDENT_AMBULATORY_CARE_PROVIDER_SITE_OTHER): Payer: 59 | Admitting: Surgical

## 2022-07-26 ENCOUNTER — Encounter: Payer: Self-pay | Admitting: Surgical

## 2022-07-26 VITALS — BP 117/78 | HR 81

## 2022-07-26 DIAGNOSIS — G8929 Other chronic pain: Secondary | ICD-10-CM

## 2022-07-26 DIAGNOSIS — M542 Cervicalgia: Secondary | ICD-10-CM

## 2022-07-26 DIAGNOSIS — N62 Hypertrophy of breast: Secondary | ICD-10-CM

## 2022-07-26 DIAGNOSIS — M546 Pain in thoracic spine: Secondary | ICD-10-CM

## 2022-07-26 NOTE — Progress Notes (Signed)
22 year old female here for follow-up after bilateral breast reduction 6 weeks ago on 06/12/2022 with Dr. Marla Roe.  She is doing well.  She has no issues at this time.  She is healing well, she reports she has been using Vaseline as well as scar creams.  Chaperone present on exam Bilateral NAC's are viable, bilateral breast incisions are intact, healing well.  No subcutaneous fluid collections noted.  No erythema or cellulitic changes of either breast.  A/P:  No restrictions at this time, we discussed to continue with garments throughout the day, however at night she can go without compression.  She can transition to wearing a normal bra 3 months postoperatively.  There is no signs of infection or concern on exam.  We will plan to see her back on an as-needed basis.  Pictures were obtained of the patient and placed in the chart with the patient's or guardian's permission.

## 2022-08-04 ENCOUNTER — Ambulatory Visit: Admit: 2022-08-04 | Discharge status: Against Medical Advice | Payer: 59 | Source: Home / Self Care

## 2022-08-04 ENCOUNTER — Ambulatory Visit (HOSPITAL_COMMUNITY): Admit: 2022-08-04 | Discharge status: Against Medical Advice | Payer: 59

## 2022-08-06 ENCOUNTER — Other Ambulatory Visit: Payer: Self-pay

## 2022-08-06 ENCOUNTER — Ambulatory Visit
Admission: RE | Admit: 2022-08-06 | Discharge: 2022-08-06 | Disposition: A | Discharge status: Home / Self Care | Payer: 59 | Source: Ambulatory Visit

## 2022-08-06 VITALS — BP 124/78 | HR 130 | Temp 100.3°F | Resp 18

## 2022-08-06 DIAGNOSIS — J020 Streptococcal pharyngitis: Secondary | ICD-10-CM

## 2022-08-06 DIAGNOSIS — R112 Nausea with vomiting, unspecified: Secondary | ICD-10-CM | POA: Diagnosis not present

## 2022-08-06 MED ORDER — AZITHROMYCIN 250 MG PO TABS: ORAL_TABLET | ORAL | 0 refills | Status: DC

## 2022-08-06 MED ORDER — ACETAMINOPHEN 325 MG PO TABS
650.0000 mg | ORAL_TABLET | Freq: Once | ORAL | Status: AC
  Administered 2022-08-06: 650 mg via ORAL

## 2022-08-06 MED ORDER — ONDANSETRON HCL 4 MG PO TABS: 4.0000 mg | ORAL_TABLET | Freq: Three times a day (TID) | ORAL | 0 refills | Status: DC | PRN

## 2022-08-06 MED ORDER — ONDANSETRON 4 MG PO TBDP
4.0000 mg | ORAL_TABLET | Freq: Once | ORAL | Status: AC
  Administered 2022-08-06: 4 mg via ORAL

## 2022-08-06 NOTE — ED Triage Notes (Signed)
Pt seen at [facility] on Sunday and sts diagnosed with strep and scarlet fever given antibiotics; pt sts since then sx have continued and now pt is vomiting and still have fever and body aches

## 2022-08-06 NOTE — Discharge Instructions (Signed)
Advised to take the Zofran 4 mg every 6 hours as needed for nausea. Advised to stop the amoxicillin and we will replace it with Zithromax, 2 tablets today and then 1 tablet daily until completed. Advised to continue using Benadryl every 6 hours as needed for rash.  Advised to increase fluid intake, rest and take Tylenol on a regular basis for fever and body aches.  Advised follow-up PCP or return to urgent care as needed.

## 2022-08-06 NOTE — ED Provider Notes (Signed)
[facility]    CSN: [id number] Arrival date & time: 08/06/22  1203      History   Chief Complaint Chief Complaint  Patient presents with   Sore Throat    Rash, fever, nausea, vomiting, headache, stomach ache - Entered by patient    HPI Cynthia Gamble is a 22 y.o. female.   22 year old female presents with strep throat, rash, nausea and vomiting.  Patient indicates that on Sunday she was seen at urgent care: And was diagnosed with strep throat, scarlatina, and she was started on amoxicillin 500 mg 3 times a day.  She relates that she has been taking the medicine on a regular basis but she indicates she feels like she has gotten worse.  She continues to have sore throat.  She indicates she is having fever, chills, nausea, vomiting, stomach pain.  She indicates that she is also taking Benadryl for the itching from the rash.  She is tolerating fluids well.  Indicates she has not been around any family, friends, or classmates that have been sick with similar type symptoms.  She is without wheezing or shortness of breath.  Concerned because she is not improved over the past 48 hours.   Sore Throat    Past Medical History:  Diagnosis Date   Diarrhea    Von Willebrand disease type [facility])     Patient Active Problem List   Diagnosis Date Noted   Symptomatic mammary hypertrophy 02/19/2022   Back pain 02/19/2022   Neck pain 02/19/2022   Short stature 09/01/2013   Diarrhea     Past Surgical History:  Procedure Laterality Date   BREAST REDUCTION SURGERY Bilateral 06/12/2022   Procedure: Bilateral breast reduction with liposuction;  Surgeon: Wallace Going, DO;  Location: [facility];  Service: Plastics;  Laterality: Bilateral;   SKIN TAG REMOVAL     WISDOM TOOTH EXTRACTION      OB History   No obstetric history on file.      Home Medications    Prior to Admission medications   Medication Sig Start Date End Date Taking? Authorizing  Provider  amoxicillin (AMOXIL) 500 MG capsule Take 500 mg by mouth 2 (two) times daily.   Yes [provider]  azithromycin (ZITHROMAX Z-PAK) 250 MG tablet 2 tablets initially and then 1 tablet daily until completed. 08/06/22  Yes Nyoka Lint, PA-C  ondansetron (ZOFRAN) 4 MG tablet Take 1 tablet (4 mg total) by mouth every 8 (eight) hours as needed for nausea or vomiting. 08/06/22  Yes Nyoka Lint, PA-C  Cholecalciferol 25 MCG (1000 UT) tablet Take by mouth.    [provider]  JUNEL FE 24 1-20 MG-MCG(24) tablet Take 1 tablet by mouth daily. 02/13/22   [provider]  ondansetron (ZOFRAN) 4 MG tablet Take 1 tablet (4 mg total) by mouth every 8 (eight) hours as needed for nausea or vomiting. 06/04/22   Scheeler, Carola Rhine, PA-C    Family History Family History  Problem Relation Age of Onset   Irritable bowel syndrome Maternal Grandmother    Lactose intolerance Maternal Grandmother    Hypertension Maternal Grandmother    Short stature Maternal Grandmother        4'10   Migraines Maternal Grandmother    Depression Maternal Grandmother    Anxiety disorder Maternal Grandmother    Cancer Paternal Grandmother    Migraines Mother    Depression Mother    Anxiety disorder Mother    Bipolar disorder Mother  Short stature Maternal Aunt        great aunt- 39'9"   Migraines Maternal Aunt    Depression Maternal Aunt    Anxiety disorder Maternal Aunt    Autism Cousin    Seizures Neg Hx    Schizophrenia Neg Hx    ADD / ADHD Neg Hx     Social History Social History   Tobacco Use   Smoking status: Never   Smokeless tobacco: Never  Vaping Use   Vaping Use: Never used  Substance Use Topics   Alcohol use: Never   Drug use: Never     Allergies   Patient has no known allergies.   Review of Systems Review of Systems  HENT:  Positive for rhinorrhea, sinus pressure and sore throat.   Respiratory:  Positive for cough.   Gastrointestinal:  Positive for nausea and  vomiting.     Physical Exam Triage Vital Signs ED Triage Vitals  Enc Vitals Group     BP 08/06/22 1237 124/78     Pulse Rate 08/06/22 1237 (!) 130     Resp 08/06/22 1237 18     Temp 08/06/22 1237 100.3 F (37.9 C)     Temp Source 08/06/22 1237 Oral     SpO2 08/06/22 1237 98 %     Weight --      Height --      Head Circumference --      Peak Flow --      Pain Score 08/06/22 1238 6     Pain Loc --      Pain Edu? --      Excl. in [place]? --    No data found.  Updated Vital Signs BP 124/78 (BP Location: Left Arm)   Pulse (!) 130   Temp 100.3 F (37.9 C) (Oral)   Resp 18   SpO2 98%   Visual Acuity Right Eye Distance:   Left Eye Distance:   Bilateral Distance:    Right Eye Near:   Left Eye Near:    Bilateral Near:     Physical Exam Constitutional:      Appearance: She is well-developed.  HENT:     Right Ear: Tympanic membrane and ear canal normal.     Left Ear: Tympanic membrane and ear canal normal.     Mouth/Throat:     Mouth: Mucous membranes are moist.     Pharynx: Oropharyngeal exudate and posterior oropharyngeal erythema present.  Cardiovascular:     Rate and Rhythm: Normal rate and regular rhythm.     Heart sounds: Normal heart sounds.  Pulmonary:     Effort: Pulmonary effort is normal.     Breath sounds: Normal breath sounds and air entry. No wheezing, rhonchi or rales.  Abdominal:     General: Abdomen is flat. Bowel sounds are normal.     Palpations: Abdomen is soft.     Tenderness: There is generalized abdominal tenderness. There is no guarding or rebound.  Lymphadenopathy:     Cervical: Cervical adenopathy (mild anterior adenopathy bilat) present.  Neurological:     Mental Status: She is alert.      UC Treatments / Results  Labs (all labs ordered are listed, but only abnormal results are displayed) Labs Reviewed - No data to display  EKG   Radiology No results found.  Procedures Procedures (including critical care time)  Medications  Ordered in UC Medications  acetaminophen (TYLENOL) tablet 650 mg (650 mg Oral Given 08/06/22 1244)  ondansetron (  ZOFRAN-ODT) disintegrating tablet 4 mg (4 mg Oral Given 08/06/22 1244)    Initial Impression / Assessment and Plan / UC Course  I have reviewed the triage vital signs and the nursing notes.  Pertinent labs & imaging results that were available during my care of the patient were reviewed by me and considered in my medical decision making (see chart for details).    Plan: The diagnosis will be treated with the following: 1.  Streptococcal sore throat: A.  Stop the amoxicillin and we will replace it with Zithromax 250 mg, 2 tablets today and then 1 tablet daily until completed. 2.  Nausea and vomiting: A.  Zofran 4 mg every 6 hours as needed for nausea and stomach cramping. B.  Continue Benadryl every 6 hours to decrease itching. C.  Advised taking Tylenol on a regular basis to control fever. 3.  Advised follow-up PCP or return to urgent care as needed. Final Clinical Impressions(s) / UC Diagnoses   Final diagnoses:  Streptococcal sore throat  Nausea and vomiting, unspecified vomiting type     Discharge Instructions      Advised to take the Zofran 4 mg every 6 hours as needed for nausea. Advised to stop the amoxicillin and we will replace it with Zithromax, 2 tablets today and then 1 tablet daily until completed. Advised to continue using Benadryl every 6 hours as needed for rash.  Advised to increase fluid intake, rest and take Tylenol on a regular basis for fever and body aches.  Advised follow-up PCP or return to urgent care as needed.    ED Prescriptions     Medication Sig Dispense Auth. Provider   azithromycin (ZITHROMAX Z-PAK) 250 MG tablet 2 tablets initially and then 1 tablet daily until completed. 6 each Nyoka Lint, PA-C   ondansetron (ZOFRAN) 4 MG tablet Take 1 tablet (4 mg total) by mouth every 8 (eight) hours as needed for nausea or vomiting. 20 tablet  Nyoka Lint, PA-C      PDMP not reviewed this encounter.   Nyoka Lint, PA-C 08/06/22 1302

## 2022-09-28 ENCOUNTER — Telehealth: Payer: 59 | Admitting: Family Medicine

## 2022-09-28 DIAGNOSIS — J029 Acute pharyngitis, unspecified: Secondary | ICD-10-CM | POA: Diagnosis not present

## 2022-09-28 NOTE — Progress Notes (Signed)

## 2022-11-20 ENCOUNTER — Ambulatory Visit (INDEPENDENT_AMBULATORY_CARE_PROVIDER_SITE_OTHER)
Admission: EM | Admit: 2022-11-20 | Discharge: 2022-11-20 | Disposition: A | Discharge status: Home / Self Care | Payer: 59 | Source: Home / Self Care

## 2022-11-20 ENCOUNTER — Encounter (HOSPITAL_COMMUNITY): Payer: Self-pay

## 2022-11-20 ENCOUNTER — Ambulatory Visit: Admit: 2022-11-20 | Discharge status: Against Medical Advice | Payer: 59

## 2022-11-20 ENCOUNTER — Emergency Department (HOSPITAL_COMMUNITY)
Admission: EM | Admit: 2022-11-20 | Discharge: 2022-11-20 | Disposition: A | Discharge status: Home / Self Care | Payer: 59 | Attending: Emergency Medicine | Admitting: Emergency Medicine

## 2022-11-20 ENCOUNTER — Emergency Department (HOSPITAL_COMMUNITY): Payer: 59

## 2022-11-20 ENCOUNTER — Telehealth: Payer: 59 | Admitting: Physician Assistant

## 2022-11-20 ENCOUNTER — Other Ambulatory Visit: Payer: Self-pay

## 2022-11-20 DIAGNOSIS — R197 Diarrhea, unspecified: Secondary | ICD-10-CM | POA: Insufficient documentation

## 2022-11-20 DIAGNOSIS — E876 Hypokalemia: Secondary | ICD-10-CM | POA: Diagnosis not present

## 2022-11-20 DIAGNOSIS — R000 Tachycardia, unspecified: Secondary | ICD-10-CM | POA: Diagnosis not present

## 2022-11-20 DIAGNOSIS — K529 Noninfective gastroenteritis and colitis, unspecified: Secondary | ICD-10-CM | POA: Insufficient documentation

## 2022-11-20 DIAGNOSIS — J029 Acute pharyngitis, unspecified: Secondary | ICD-10-CM | POA: Insufficient documentation

## 2022-11-20 DIAGNOSIS — Z1152 Encounter for screening for COVID-19: Secondary | ICD-10-CM | POA: Insufficient documentation

## 2022-11-20 DIAGNOSIS — B349 Viral infection, unspecified: Secondary | ICD-10-CM

## 2022-11-20 DIAGNOSIS — R1030 Lower abdominal pain, unspecified: Secondary | ICD-10-CM

## 2022-11-20 LAB — COMPREHENSIVE METABOLIC PANEL
ALT: 24 U/L (ref 0–44)
AST: 19 U/L (ref 15–41)
Albumin: 3.6 g/dL (ref 3.5–5.0)
Alkaline Phosphatase: 77 U/L (ref 38–126)
Anion gap: 11 (ref 5–15)
BUN: <5 mg/dL — ABNORMAL LOW (ref 6–20)
CO2: 21 mmol/L — ABNORMAL LOW (ref 22–32)
Calcium: 8.7 mg/dL — ABNORMAL LOW (ref 8.9–10.3)
Chloride: 103 mmol/L (ref 98–111)
Creatinine, Ser: 0.7 mg/dL (ref 0.44–1.00)
GFR, Estimated: >60 mL/min (ref >60)
Glucose, Bld: 89 mg/dL (ref 70–99)
Potassium: 2.9 mmol/L — ABNORMAL LOW (ref 3.5–5.1)
Sodium: 135 mmol/L (ref 135–145)
Total Bilirubin: 0.6 mg/dL (ref 0.3–1.2)
Total Protein: 7.2 g/dL (ref 6.5–8.1)

## 2022-11-20 LAB — URINALYSIS, ROUTINE W REFLEX MICROSCOPIC
Bacteria, UA: NONE SEEN
Bilirubin Urine: NEGATIVE
Bilirubin Urine: NEGATIVE
Glucose, UA: NEGATIVE mg/dL
Glucose, UA: NEGATIVE mg/dL
Ketones, ur: NEGATIVE mg/dL
Ketones, ur: NEGATIVE mg/dL
Nitrite: NEGATIVE
Nitrite: NEGATIVE
Protein, ur: 30 mg/dL — AB
Protein, ur: NEGATIVE mg/dL
Specific Gravity, Urine: 1.027 (ref 1.005–1.030)
Specific Gravity, Urine: 1.01 (ref 1.005–1.030)
pH: 5 (ref 5.0–8.0)
pH: 6 (ref 5.0–8.0)

## 2022-11-20 LAB — RESP PANEL BY RT-PCR (RSV, FLU A&B, COVID)  RVPGX2
Influenza A by PCR: NEGATIVE
Influenza B by PCR: NEGATIVE
Resp Syncytial Virus by PCR: NEGATIVE
SARS Coronavirus 2 by RT PCR: NEGATIVE

## 2022-11-20 LAB — I-STAT BETA HCG BLOOD, ED (MC, WL, AP ONLY): I-stat hCG, quantitative: <5 m[IU]/mL (ref <5)

## 2022-11-20 LAB — CBC
HCT: 37.1 % (ref 36.0–46.0)
Hemoglobin: 12 g/dL (ref 12.0–15.0)
MCH: 27.8 pg (ref 26.0–34.0)
MCHC: 32.3 g/dL (ref 30.0–36.0)
MCV: 85.9 fL (ref 80.0–100.0)
Platelets: 284 10*3/uL (ref 150–400)
RBC: 4.32 MIL/uL (ref 3.87–5.11)
RDW: 13.6 % (ref 11.5–15.5)
WBC: 5.8 10*3/uL (ref 4.0–10.5)
nRBC: 0 % (ref 0.0–0.2)

## 2022-11-20 LAB — LIPASE, BLOOD: Lipase: 22 U/L (ref 11–51)

## 2022-11-20 LAB — POCT RAPID STREP A (OFFICE): Rapid Strep A Screen: NEGATIVE

## 2022-11-20 LAB — MAGNESIUM: Magnesium: 1.9 mg/dL (ref 1.7–2.4)

## 2022-11-20 MED ORDER — LACTATED RINGERS IV BOLUS
500.0000 mL | Freq: Once | INTRAVENOUS | Status: AC
  Administered 2022-11-20: 500 mL via INTRAVENOUS

## 2022-11-20 MED ORDER — ACETAMINOPHEN 500 MG PO TABS
1000.0000 mg | ORAL_TABLET | Freq: Once | ORAL | Status: AC
  Administered 2022-11-20: 1000 mg via ORAL
  Filled 2022-11-20: qty 2

## 2022-11-20 MED ORDER — DICYCLOMINE HCL 10 MG/5ML PO SOLN
10.0000 mg | Freq: Once | ORAL | Status: AC
  Administered 2022-11-20: 10 mg via ORAL
  Filled 2022-11-20: qty 5

## 2022-11-20 MED ORDER — DICYCLOMINE HCL 20 MG PO TABS: 20.0000 mg | ORAL_TABLET | Freq: Two times a day (BID) | ORAL | 0 refills | Status: DC

## 2022-11-20 MED ORDER — KETOROLAC TROMETHAMINE 15 MG/ML IJ SOLN
15.0000 mg | Freq: Once | INTRAMUSCULAR | Status: AC
  Administered 2022-11-20: 15 mg via INTRAVENOUS
  Filled 2022-11-20: qty 1

## 2022-11-20 MED ORDER — ALUM & MAG HYDROXIDE-SIMETH 200-200-20 MG/5ML PO SUSP
30.0000 mL | Freq: Once | ORAL | Status: AC
  Administered 2022-11-20: 30 mL via ORAL
  Filled 2022-11-20: qty 30

## 2022-11-20 MED ORDER — POTASSIUM CHLORIDE CRYS ER 20 MEQ PO TBCR
60.0000 meq | EXTENDED_RELEASE_TABLET | Freq: Once | ORAL | Status: AC
  Administered 2022-11-20: 60 meq via ORAL
  Filled 2022-11-20: qty 3

## 2022-11-20 MED ORDER — IOHEXOL 350 MG/ML SOLN
75.0000 mL | Freq: Once | INTRAVENOUS | Status: AC | PRN
  Administered 2022-11-20: 75 mL via INTRAVENOUS

## 2022-11-20 MED ORDER — ONDANSETRON 4 MG PO TBDP: 4.0000 mg | ORAL_TABLET | Freq: Three times a day (TID) | ORAL | 0 refills | Status: DC | PRN

## 2022-11-20 MED ORDER — AMOXICILLIN-POT CLAVULANATE 875-125 MG PO TABS: 1.0000 | ORAL_TABLET | Freq: Two times a day (BID) | ORAL | 0 refills | Status: DC

## 2022-11-20 MED ORDER — SODIUM CHLORIDE 0.9 % IV BOLUS
1000.0000 mL | Freq: Once | INTRAVENOUS | Status: AC
  Administered 2022-11-20: 1000 mL via INTRAVENOUS

## 2022-11-20 MED ORDER — LOPERAMIDE HCL 2 MG PO CAPS
4.0000 mg | ORAL_CAPSULE | Freq: Once | ORAL | Status: AC
  Administered 2022-11-20: 4 mg via ORAL
  Filled 2022-11-20: qty 2

## 2022-11-20 MED ORDER — LOPERAMIDE HCL 2 MG PO CAPS: 2.0000 mg | ORAL_CAPSULE | Freq: Four times a day (QID) | ORAL | 0 refills | Status: DC | PRN

## 2022-11-20 MED ORDER — SODIUM CHLORIDE 0.9 % IV BOLUS: 1000.0000 mL | Freq: Once | INTRAVENOUS | Status: DC

## 2022-11-20 NOTE — Progress Notes (Signed)
Message sent to patient requesting further input regarding current symptoms. Awaiting patient response.  

## 2022-11-20 NOTE — ED Notes (Signed)
Patient has returned from CT

## 2022-11-20 NOTE — Discharge Instructions (Addendum)
You came to the emergency department today with abdominal discomfort, diarrhea, headache and sore throat.  Your potassium was 2.9 and you were treated for this.  We have attached information about low potassium to these discharge papers so that you can increase it in your diet.  You are not septic during your visit today.  This is reassuring.  Originally your heart rate was elevated due to your fever.  You no longer have a fever at this time however as discussed you may alternate Tylenol and ibuprofen for any continued fevers. Your urine sample does not show a urinary tract infection either.   Your CT scan showed nonspecific inflammation of your colon.  This is something you can talk to a gastroenterologist about.  Look out for phone call from them within the next 72 hours.  If you do not hear from them you may follow-up with your primary care doctor for referral.  We have sent the following medications to your pharmacy:  Zofran for nausea Imodium for diarrhea Dicyclomine for abdominal cramping Augmentin to cover you for infection. Take this with food as it may upset your stomach.   If your symptoms worsen, you are unable to bring your temperatures below 100.4 at home with Tylenol and ibuprofen, you are unable to tolerate any food or drink or you have difficulty urinating please do not hesitate to return to the emergency department.

## 2022-11-20 NOTE — Discharge Instructions (Signed)
Please go to the emergency department as soon as you leave urgent care for further evaluation and management. ?

## 2022-11-20 NOTE — ED Triage Notes (Signed)
Pt c/o abd pain, diarrhea, headache, sore throat   Onset ~ Sunday

## 2022-11-20 NOTE — Progress Notes (Signed)
Now mentioning abdominal pain and diarrhea with her high fever. Face-to-face recommended. No charge for e-visit.

## 2022-11-20 NOTE — Progress Notes (Signed)
  E-Visit for Sore Throat  We are sorry that you are not feeling well.  Here is how we plan to help!  Your symptoms indicate a likely viral infection (Pharyngitis).   Pharyngitis is inflammation in the back of the throat which can cause a sore throat, scratchiness and sometimes difficulty swallowing.   Pharyngitis is typically caused by a respiratory virus and will just run its course.  Please keep in mind that your symptoms could last up to 10 days.  For throat pain, we recommend over the counter oral pain relief medications such as acetaminophen or aspirin, or anti-inflammatory medications such as ibuprofen or naproxen sodium.  Topical treatments such as oral throat lozenges or sprays may be used as needed.  Avoid close contact with loved ones, especially the very young and elderly.  Remember to wash your hands thoroughly throughout the day as this is the number one way to prevent the spread of infection and wipe down door knobs and counters with disinfectant.  After careful review of your answers, I would not recommend an antibiotic for your condition.  Antibiotics should not be used to treat conditions that we suspect are caused by viruses like the virus that causes the common cold or flu. However, some people can have Strep with atypical symptoms. You may need formal testing in clinic or office to confirm if your symptoms continue or worsen -- including throat culture and mono testing to be cautious.  Providers prescribe antibiotics to treat infections caused by bacteria. Antibiotics are very powerful in treating bacterial infections when they are used properly.  To maintain their effectiveness, they should be used only when necessary.  Overuse of antibiotics has resulted in the development of super bugs that are resistant to treatment!    Home Care: Only take medications as instructed by your medical team. Do not drink alcohol while taking these medications. A steam or ultrasonic humidifier can  help congestion.  You can place a towel over your head and breathe in the steam from hot water coming from a faucet. Avoid close contacts especially the very young and the elderly. Cover your mouth when you cough or sneeze. Always remember to wash your hands.  Get Help Right Away If: You develop worsening fever or throat pain. You develop a severe head ache or visual changes. Your symptoms persist after you have completed your treatment plan.  Make sure you Understand these instructions. Will watch your condition. Will get help right away if you are not doing well or get worse.   Thank you for choosing an e-visit.  Your e-visit answers were reviewed by a board certified advanced clinical practitioner to complete your personal care plan. Depending upon the condition, your plan could have included both over the counter or prescription medications.  Please review your pharmacy choice. Make sure the pharmacy is open so you can pick up prescription now. If there is a problem, you may contact your provider through [employer] and have the prescription routed to another pharmacy.  Your safety is important to [place]. If you have drug allergies check your prescription carefully.   For the next 24 hours you can use MyChart to ask questions about today's visit, request a non-urgent call back, or ask for a work or school excuse. You will get an email in the next two days asking about your experience. I hope that your e-visit has been valuable and will speed your recovery.

## 2022-11-20 NOTE — ED Notes (Signed)
Patient transported to CT 

## 2022-11-20 NOTE — Progress Notes (Signed)
I have spent 5 minutes in review of e-visit questionnaire, review and updating patient chart, medical decision making and response to patient.   Aryon Nham Cody Chanan Detwiler, PA-C    

## 2022-11-20 NOTE — ED Triage Notes (Signed)
Pt came in via POV d/t diarrhea for the last 3 days with 10/10 abd pain, then 2 days ago a HA started next to her eyes & her diarrhea worsened & then today she states her diarrhea is very often & she has a hard time going long between bathroom visits. Her throat also started hurting yesterday & she woke up this morning with a 103.1 fever.

## 2022-11-20 NOTE — ED Provider Notes (Signed)
[facility]    CSN: [id number] Arrival date & time: 11/20/22  0825      History   Chief Complaint Chief Complaint  Patient presents with   Diarrhea    Stomach pain started Sunday as well as diarrhea, headache started Monday and throat started hurting Tuesday. - Entered by patient    HPI Cynthia Gamble is a 22 y.o. female.   Patient presents with diarrhea, stomach pain, sore throat.  Reports diarrhea started about 4 to 5 days ago.  Then, patient developed a headache a few days after.  Then, patient developed a sore throat yesterday.  Patient denies any known sick contacts.  Reports that she developed a fever this morning of 103.  Has taken Tylenol for fever.  Reports that she has been drinking water but has not been able to tolerate foods.  Denies nausea, vomiting, nasal congestion, runny nose, cough.  Reports stomach pain seems to be worsening.  Denies blood in stool.  Last menstrual cycle was approximately 1 month ago but patient is adamant that she is not pregnant.   Diarrhea   Past Medical History:  Diagnosis Date   Diarrhea    Von Willebrand disease type IA [facility])     Patient Active Problem List   Diagnosis Date Noted   Symptomatic mammary hypertrophy 02/19/2022   Back pain 02/19/2022   Neck pain 02/19/2022   Short stature 09/01/2013   Diarrhea     Past Surgical History:  Procedure Laterality Date   BREAST REDUCTION SURGERY Bilateral 06/12/2022   Procedure: Bilateral breast reduction with liposuction;  Surgeon: Peggye Form, DO;  Location: [facility];  Service: Plastics;  Laterality: Bilateral;   SKIN TAG REMOVAL     WISDOM TOOTH EXTRACTION      OB History   No obstetric history on file.      Home Medications    Prior to Admission medications   Medication Sig Start Date End Date Taking? Authorizing Provider  amoxicillin (AMOXIL) 500 MG capsule Take 500 mg by mouth 2 (two) times daily.    [provider]   azithromycin (ZITHROMAX Z-PAK) 250 MG tablet 2 tablets initially and then 1 tablet daily until completed. 08/06/22   Ellsworth Lennox, PA-C  Cholecalciferol 25 MCG (1000 UT) tablet Take by mouth.    [provider]  JUNEL FE 24 1-20 MG-MCG(24) tablet Take 1 tablet by mouth daily. 02/13/22   [provider]  ondansetron (ZOFRAN) 4 MG tablet Take 1 tablet (4 mg total) by mouth every 8 (eight) hours as needed for nausea or vomiting. 06/04/22   Scheeler, Kermit Balo, PA-C  ondansetron (ZOFRAN) 4 MG tablet Take 1 tablet (4 mg total) by mouth every 8 (eight) hours as needed for nausea or vomiting. 08/06/22   Ellsworth Lennox, PA-C    Family History Family History  Problem Relation Age of Onset   Irritable bowel syndrome Maternal Grandmother    Lactose intolerance Maternal Grandmother    Hypertension Maternal Grandmother    Short stature Maternal Grandmother        4'10   Migraines Maternal Grandmother    Depression Maternal Grandmother    Anxiety disorder Maternal Grandmother    Cancer Paternal Grandmother    Migraines Mother    Depression Mother    Anxiety disorder Mother    Bipolar disorder Mother    Short stature Maternal Aunt        great aunt- 4'9"   Migraines Maternal Aunt  Depression Maternal Aunt    Anxiety disorder Maternal Aunt    Autism Cousin    Seizures Neg Hx    Schizophrenia Neg Hx    ADD / ADHD Neg Hx     Social History Social History   Tobacco Use   Smoking status: Never   Smokeless tobacco: Never  Vaping Use   Vaping Use: Never used  Substance Use Topics   Alcohol use: Never   Drug use: Never     Allergies   Patient has no known allergies.   Review of Systems Review of Systems Per HPI  Physical Exam Triage Vital Signs ED Triage Vitals [11/20/22 0901]  Enc Vitals Group     BP 114/79     Pulse Rate (!) 120     Resp 16     Temp 100.3 F (37.9 C)     Temp Source Oral     SpO2 98 %     Weight      Height      Head Circumference       Peak Flow      Pain Score 7     Pain Loc      Pain Edu?      Excl. in GC?    No data found.  Updated Vital Signs BP 114/79 (BP Location: Right Arm)   Pulse (!) 120   Temp 100.3 F (37.9 C) (Oral)   Resp 16   SpO2 98%   Visual Acuity Right Eye Distance:   Left Eye Distance:   Bilateral Distance:    Right Eye Near:   Left Eye Near:    Bilateral Near:     Physical Exam Constitutional:      General: She is not in acute distress.    Appearance: Normal appearance. She is not toxic-appearing or diaphoretic.  HENT:     Head: Normocephalic and atraumatic.     Right Ear: Tympanic membrane and ear canal normal.     Left Ear: Tympanic membrane and ear canal normal.     Nose: No congestion.     Mouth/Throat:     Mouth: Mucous membranes are moist.     Pharynx: Posterior oropharyngeal erythema present. No pharyngeal swelling, oropharyngeal exudate or uvula swelling.     Tonsils: No tonsillar exudate or tonsillar abscesses.  Eyes:     Extraocular Movements: Extraocular movements intact.     Conjunctiva/sclera: Conjunctivae normal.     Pupils: Pupils are equal, round, and reactive to light.  Cardiovascular:     Rate and Rhythm: Normal rate and regular rhythm.     Pulses: Normal pulses.     Heart sounds: Normal heart sounds.  Pulmonary:     Effort: Pulmonary effort is normal. No respiratory distress.     Breath sounds: Normal breath sounds. No stridor. No wheezing, rhonchi or rales.  Abdominal:     General: Abdomen is flat. Bowel sounds are normal. There is no distension.     Palpations: Abdomen is soft.     Tenderness: There is no abdominal tenderness.  Musculoskeletal:        General: Normal range of motion.     Cervical back: Normal range of motion.  Skin:    General: Skin is warm and dry.  Neurological:     General: No focal deficit present.     Mental Status: She is alert and oriented to person, place, and time. Mental status is at baseline.  Psychiatric:  Mood  and Affect: Mood normal.        Behavior: Behavior normal.      UC Treatments / Results  Labs (all labs ordered are listed, but only abnormal results are displayed) Labs Reviewed  CULTURE, GROUP A STREP [facility])  POCT RAPID STREP A (OFFICE)    EKG   Radiology No results found.  Procedures Procedures (including critical care time)  Medications Ordered in UC Medications - No data to display   Initial Impression / Assessment and Plan / UC Course  I have reviewed the triage vital signs and the nursing notes.  Pertinent labs & imaging results that were available during my care of the patient were reviewed by me and considered in my medical decision making (see chart for details).     Suspect viral cause to patient's symptoms.  Rapid strep is negative.  Throat culture pending. The original plan was to give patient a normal saline bolus to help replace fluid loss associated with diarrhea and help tachycardia. Apparently we do not have the fluids here in urgent care today.  Therefore, recommended to patient that she go to the ER to have this completed.  She was agreeable with this plan.  Vital signs stable at discharge.  Agree with patient self transport to the hospital.  Peripheral IV that was placed by nursing staff was removed by myself with no complications. Final Clinical Impressions(s) / UC Diagnoses   Final diagnoses:  Sore throat  Diarrhea, unspecified type  Viral illness     Discharge Instructions      Please go to the emergency department as soon as you leave urgent care for further evaluation and management.     ED Prescriptions   None    PDMP not reviewed this encounter.   Gustavus Bryant, [place] 11/20/22 1014

## 2022-11-20 NOTE — ED Provider Notes (Signed)
[place] EMERGENCY DEPARTMENT AT [facility] Provider Note   CSN: [id number] Arrival date & time: 11/20/22  1053     History No chief complaint on file.   Cynthia Gamble is a 22 y.o. female presenting from urgent care due to viral symptoms.  Endorses 5 days of diarrhea.  Shortly after diarrhea started she developed headaches.  She rates her headaches 10 out of 10 and feels most of the pain behind her eyes no numbness, tingling, double or blurred vision.  Yesterday she started to notice a sore throat and this morning she woke up with a fever of 103.  Fever came down with Tylenol at 6 AM however she has not had any medication since then.  Patient went to urgent care and they wanted to start IV fluids but they did not have any so they sent her to the emergency department.  No urinary symptoms.  No vaginal symptoms.  No recent travel, surgery, smoking, history of DVT/PE or leg swelling but does report taking OCPs   HPI     Home Medications Prior to Admission medications   Medication Sig Start Date End Date Taking? Authorizing Provider  amoxicillin (AMOXIL) 500 MG capsule Take 500 mg by mouth 2 (two) times daily.    [provider]  azithromycin (ZITHROMAX Z-PAK) 250 MG tablet 2 tablets initially and then 1 tablet daily until completed. 08/06/22   Ellsworth Lennox, PA-C  Cholecalciferol 25 MCG (1000 UT) tablet Take by mouth.    [provider]  JUNEL FE 24 1-20 MG-MCG(24) tablet Take 1 tablet by mouth daily. 02/13/22   [provider]  ondansetron (ZOFRAN) 4 MG tablet Take 1 tablet (4 mg total) by mouth every 8 (eight) hours as needed for nausea or vomiting. 06/04/22   Scheeler, Kermit Balo, PA-C  ondansetron (ZOFRAN) 4 MG tablet Take 1 tablet (4 mg total) by mouth every 8 (eight) hours as needed for nausea or vomiting. 08/06/22   Ellsworth Lennox, PA-C      Allergies    Patient has no known allergies.    Review of Systems   Review of Systems  Physical  Exam Updated Vital Signs BP 119/79 (BP Location: Right Arm)   Pulse (!) 128   Temp 98.7 F (37.1 C) (Oral)   Resp 16   Ht 4\' 9"  (1.448 m)   Wt 67.1 kg   SpO2 100%   BMI 32.03 kg/m  Physical Exam Vitals and nursing note reviewed.  Constitutional:      General: She is not in acute distress.    Appearance: Normal appearance. She is not ill-appearing.  HENT:     Head: Normocephalic and atraumatic.     Mouth/Throat:     Mouth: Mucous membranes are moist.     Pharynx: Oropharynx is clear. Posterior oropharyngeal erythema present. No oropharyngeal exudate.  Eyes:     General: No scleral icterus.    Conjunctiva/sclera: Conjunctivae normal.  Cardiovascular:     Rate and Rhythm: Regular rhythm. Tachycardia present.  Pulmonary:     Effort: Pulmonary effort is normal. No respiratory distress.     Breath sounds: No wheezing or rales.  Abdominal:     General: Abdomen is flat.     Palpations: Abdomen is soft.     Tenderness: There is abdominal tenderness (generalized left sided, no right sided).  Musculoskeletal:     Right lower leg: No edema.     Left lower leg: No edema.  Skin:    General:  Skin is warm and dry.     Findings: No rash.  Neurological:     Mental Status: She is alert.     Comments: Cranial nerves II through XII grossly intact.  Full range of motion and normal strength in bilateral upper and lower extremities.  No facial droop or aphasia.  Psychiatric:        Mood and Affect: Mood normal.     ED Results / Procedures / Treatments   Labs (all labs ordered are listed, but only abnormal results are displayed) Labs Reviewed  COMPREHENSIVE METABOLIC PANEL - Abnormal; Notable for the following components:      Result Value   Potassium 2.9 (*)    CO2 21 (*)    BUN <5 (*)    Calcium 8.7 (*)    All other components within normal limits  URINALYSIS, ROUTINE W REFLEX MICROSCOPIC - Abnormal; Notable for the following components:   APPearance HAZY (*)    Hgb urine  dipstick SMALL (*)    Protein, ur 30 (*)    Leukocytes,Ua SMALL (*)    Bacteria, UA RARE (*)    All other components within normal limits  URINALYSIS, ROUTINE W REFLEX MICROSCOPIC - Abnormal; Notable for the following components:   APPearance HAZY (*)    Hgb urine dipstick SMALL (*)    Leukocytes,Ua TRACE (*)    All other components within normal limits  RESP PANEL BY RT-PCR (RSV, FLU A&B, COVID)  RVPGX2  LIPASE, BLOOD  CBC  MAGNESIUM  I-STAT BETA HCG BLOOD, ED ([facility], [facility], [facility] ONLY)    EKG None  Radiology CT ABDOMEN PELVIS W CONTRAST  Result Date: 11/20/2022 CLINICAL DATA:  Fever, abdominal pain, and diarrhea. EXAM: CT ABDOMEN AND PELVIS WITH CONTRAST TECHNIQUE: Multidetector CT imaging of the abdomen and pelvis was performed using the standard protocol following bolus administration of intravenous contrast. RADIATION DOSE REDUCTION: This exam was performed according to the departmental dose-optimization program which includes automated exposure control, adjustment of the mA and/or kV according to patient size and/or use of iterative reconstruction technique. CONTRAST:  75mL OMNIPAQUE IOHEXOL 350 MG/ML SOLN COMPARISON:  None Available. FINDINGS: Lower chest: No acute abnormality. Hepatobiliary: No focal liver abnormality is seen. No gallstones, gallbladder wall thickening, or biliary dilatation. Pancreas: Unremarkable. No pancreatic ductal dilatation or surrounding inflammatory changes. Spleen: Normal in size without focal abnormality. Adrenals/Urinary Tract: Adrenal glands are unremarkable. Kidneys are normal, without renal calculi, focal lesion, or hydronephrosis. Bladder is unremarkable. Stomach/Bowel: The stomach is within normal limits. Diffuse mild wall thickening of the colon. There is circumferential wall thickening of the terminal ileum as well. Otherwise normal small bowel. No obstruction. Normal appendix. Vascular/Lymphatic: No significant vascular findings are present. No  pathologically enlarged abdominal or pelvic lymph nodes. Prominent subcentimeter mesenteric lymph nodes are likely reactive. Reproductive: Uterus and bilateral adnexa are unremarkable. Other: No abdominal wall hernia or abnormality. No abdominopelvic ascites. No pneumoperitoneum. Musculoskeletal: No acute or significant osseous findings. IMPRESSION: 1. Diffuse colonic wall thickening, also involving the terminal ileum, concerning for infectious or inflammatory enterocolitis. Electronically Signed   By: Obie Dredge M.D.   On: 11/20/2022 16:11    Procedures Procedures   Medications Ordered in ED Medications  sodium chloride 0.9 % bolus 1,000 mL (0 mLs Intravenous Stopped 11/20/22 1436)  loperamide (IMODIUM) capsule 4 mg (4 mg Oral Given 11/20/22 1247)  ketorolac (TORADOL) 15 MG/ML injection 15 mg (15 mg Intravenous Given 11/20/22 1248)  acetaminophen (TYLENOL) tablet 1,000 mg (1,000 mg Oral  Given 11/20/22 1247)  potassium chloride SA (KLOR-CON M) CR tablet 60 mEq (60 mEq Oral Given 11/20/22 1310)  alum & mag hydroxide-simeth (MAALOX/MYLANTA) 200-200-20 MG/5ML suspension 30 mL (30 mLs Oral Given 11/20/22 1459)  dicyclomine (BENTYL) 10 MG/5ML solution 10 mg (10 mg Oral Given 11/20/22 1500)  lactated ringers bolus 500 mL (0 mLs Intravenous Stopped 11/20/22 1630)  iohexol (OMNIPAQUE) 350 MG/ML injection 75 mL (75 mLs Intravenous Contrast Given 11/20/22 1555)    ED Course/ Medical Decision Making/ A&P Clinical Course as of 11/20/22 1722  Wed Nov 20, 2022  1244 Temp 102 per tech [MR]  1417 BP 98/69 and 97/64  [MR]  1429 I spoke with the patient and her mother at bedside.  They are concerned that she will be discharged and need to come right back to the hospital.  Mother reports that she had an instance where she was sent home and had to return. I asked them there worries. Patient is still having severe abdominal pain.  I told her that we will try GI cocktail.  Mother then asked me if we are trying to rule  out a blood clot.  I discussed that we often use PERC and Wells criteria.  PERC was not applied due to her heart rate however she is low suspicion PE.  Tachycardia is likely secondary to fever in the setting of viral illness.  This was discussed with the family however they were not fully convinced.  I offered the D-dimer but explained that if this is elevated we would have to do a CT.  We discussed often times to try to avoid the CT scan due to the risks of radiation and contrast in young women but that I am again willing to order it.  They then decided they would prefer to wait on the D-dimer until the viral swab returned.  Of note, patient's heart rate is 96 on the heart monitor.  Blood pressures 90s over 60s.  Family members called somebody that they know with an insurance company who spoke about how the patient meets admission and sepsis criteria.  When asked how she met sepsis criteria they cited her blood pressure (which is within normal limits) and heart rate that is likely secondary to febrile illness.  She also does not have a WBC count making sepsis even further unlikely.  We discussed that her urine sample was contaminated since they are concerned that the sepsis is from a UTI.  She was offered the ability to give a repeat sample.  I again explained to them that patient does not meet admission criteria and that the results of the viral swab would not change that at this time.  Patient subsequently asked for a different healthcare provider. MD Jeraldine Loots made aware and will attempt to speak with the patient and her mother. [MR]  1510 Most recent vitals with a blood pressure of 110/73, temperature 99.5 after Tylenol and heart rate 100 [MR]    Clinical Course User Index [MR] Ulice Follett, Gabriel Cirri, PA-C                             Medical Decision Making Amount and/or Complexity of Data Reviewed Labs: ordered. Radiology: ordered.  Risk OTC drugs. Prescription drug management.   22 year old  female presenting today with flulike symptoms over the past couple of days.  Sent here from urgent care for IV fluids.  On arrival she was afebrile but with a heart  rate of 128.  Did consider PE however in the setting of all of her other viral symptoms I suspect that a viral assess the etiology of her tachycardia.  Only risk factor for PE is OCPs.  Denies any chest pain or shortness of breath.  Lab work initiated in triage.  Remarkable for potassium of 2.9  Treatment: Oral potassium, IV fluids, Imodium, Toradol and Tylenol. Continues with pain, will trial GI cocktail.  Dispo: 22 year old female with viral symptoms.  Lab work remarkable for potassium of 2.9 which was repleted.  Did have some signs of bacteriuria however patient denies any symptoms and it was not a clean sample.  She gave a repeat sample which also did not show a urinary tract infection.  Please see ED course above however patient was also seen by my attending physician.  CT scan was ordered and showed some colitis including the terminal ileum.  She was given a referral to gastroenterology and Augmentin.  Additionally she was instructed on supportive care and given Zofran, Imodium and Bentyl.   Final Clinical Impression(s) / ED Diagnoses Final diagnoses:  Hypokalemia  Colitis    Rx / DC Orders ED Discharge Orders          Ordered    loperamide (IMODIUM) 2 MG capsule  4 times daily PRN        11/20/22 1301    ondansetron (ZOFRAN-ODT) 4 MG disintegrating tablet  Every 8 hours PRN        11/20/22 1301    dicyclomine (BENTYL) 20 MG tablet  2 times daily        11/20/22 1416    amoxicillin-clavulanate (AUGMENTIN) 875-125 MG tablet  Every 12 hours        11/20/22 1652           Results and diagnoses were explained to the patient. Return precautions discussed in full. Patient had no additional questions and expressed complete understanding.   This chart was dictated using voice recognition software.  Despite best efforts  to proofread,  errors can occur which can change the documentation meaning.    Woodroe Chen 11/20/22 1727    Gerhard Munch, MD 11/21/22 [phone]

## 2022-11-22 LAB — CULTURE, GROUP A STREP (THRC)

## 2022-12-02 ENCOUNTER — Encounter: Payer: Self-pay | Admitting: Gastroenterology

## 2023-03-05 ENCOUNTER — Ambulatory Visit: Payer: 59 | Admitting: Gastroenterology

## 2023-05-28 ENCOUNTER — Ambulatory Visit
Admission: EM | Admit: 2023-05-28 | Discharge: 2023-05-28 | Disposition: A | Discharge status: Home / Self Care | Payer: 59

## 2023-05-28 DIAGNOSIS — B9689 Other specified bacterial agents as the cause of diseases classified elsewhere: Secondary | ICD-10-CM | POA: Insufficient documentation

## 2023-05-28 DIAGNOSIS — Z00129 Encounter for routine child health examination without abnormal findings: Secondary | ICD-10-CM | POA: Insufficient documentation

## 2023-05-28 DIAGNOSIS — D699 Hemorrhagic condition, unspecified: Secondary | ICD-10-CM | POA: Insufficient documentation

## 2023-05-28 DIAGNOSIS — E669 Obesity, unspecified: Secondary | ICD-10-CM | POA: Insufficient documentation

## 2023-05-28 DIAGNOSIS — Z77011 Contact with and (suspected) exposure to lead: Secondary | ICD-10-CM | POA: Insufficient documentation

## 2023-05-28 DIAGNOSIS — R197 Diarrhea, unspecified: Secondary | ICD-10-CM | POA: Insufficient documentation

## 2023-05-28 DIAGNOSIS — Z20828 Contact with and (suspected) exposure to other viral communicable diseases: Secondary | ICD-10-CM | POA: Insufficient documentation

## 2023-05-28 DIAGNOSIS — T148XXA Other injury of unspecified body region, initial encounter: Secondary | ICD-10-CM | POA: Insufficient documentation

## 2023-05-28 DIAGNOSIS — R053 Chronic cough: Secondary | ICD-10-CM | POA: Insufficient documentation

## 2023-05-28 DIAGNOSIS — H109 Unspecified conjunctivitis: Secondary | ICD-10-CM | POA: Insufficient documentation

## 2023-05-28 DIAGNOSIS — R109 Unspecified abdominal pain: Secondary | ICD-10-CM | POA: Insufficient documentation

## 2023-05-28 DIAGNOSIS — K529 Noninfective gastroenteritis and colitis, unspecified: Secondary | ICD-10-CM | POA: Insufficient documentation

## 2023-05-28 DIAGNOSIS — G43909 Migraine, unspecified, not intractable, without status migrainosus: Secondary | ICD-10-CM | POA: Insufficient documentation

## 2023-05-28 DIAGNOSIS — Z23 Encounter for immunization: Secondary | ICD-10-CM | POA: Insufficient documentation

## 2023-05-28 DIAGNOSIS — K591 Functional diarrhea: Secondary | ICD-10-CM | POA: Insufficient documentation

## 2023-05-28 DIAGNOSIS — J029 Acute pharyngitis, unspecified: Secondary | ICD-10-CM | POA: Insufficient documentation

## 2023-05-28 DIAGNOSIS — Z719 Counseling, unspecified: Secondary | ICD-10-CM | POA: Insufficient documentation

## 2023-05-28 DIAGNOSIS — S91331A Puncture wound without foreign body, right foot, initial encounter: Secondary | ICD-10-CM | POA: Diagnosis not present

## 2023-05-28 DIAGNOSIS — R29898 Other symptoms and signs involving the musculoskeletal system: Secondary | ICD-10-CM | POA: Insufficient documentation

## 2023-05-28 DIAGNOSIS — R519 Headache, unspecified: Secondary | ICD-10-CM | POA: Insufficient documentation

## 2023-05-28 DIAGNOSIS — M79672 Pain in left foot: Secondary | ICD-10-CM | POA: Insufficient documentation

## 2023-05-28 DIAGNOSIS — J40 Bronchitis, not specified as acute or chronic: Secondary | ICD-10-CM | POA: Insufficient documentation

## 2023-05-28 DIAGNOSIS — K580 Irritable bowel syndrome with diarrhea: Secondary | ICD-10-CM | POA: Insufficient documentation

## 2023-05-28 DIAGNOSIS — Z713 Dietary counseling and surveillance: Secondary | ICD-10-CM | POA: Insufficient documentation

## 2023-05-28 DIAGNOSIS — Z7182 Exercise counseling: Secondary | ICD-10-CM | POA: Insufficient documentation

## 2023-05-28 DIAGNOSIS — D6800 Von Willebrand disease, unspecified: Secondary | ICD-10-CM | POA: Insufficient documentation

## 2023-05-28 DIAGNOSIS — D649 Anemia, unspecified: Secondary | ICD-10-CM | POA: Insufficient documentation

## 2023-05-28 DIAGNOSIS — J069 Acute upper respiratory infection, unspecified: Secondary | ICD-10-CM | POA: Insufficient documentation

## 2023-05-28 DIAGNOSIS — L918 Other hypertrophic disorders of the skin: Secondary | ICD-10-CM | POA: Insufficient documentation

## 2023-05-28 DIAGNOSIS — Z68.41 Body mass index (BMI) pediatric, 85th percentile to less than 95th percentile for age: Secondary | ICD-10-CM | POA: Insufficient documentation

## 2023-05-28 MED ORDER — TETANUS-DIPHTH-ACELL PERTUSSIS 5-2.5-18.5 LF-MCG/0.5 IM SUSY
0.5000 mL | PREFILLED_SYRINGE | Freq: Once | INTRAMUSCULAR | Status: AC
  Administered 2023-05-28: 0.5 mL via INTRAMUSCULAR

## 2023-05-28 MED ORDER — LEVOFLOXACIN 500 MG PO TABS: 500.0000 mg | ORAL_TABLET | Freq: Every day | ORAL | 0 refills | Status: DC

## 2023-05-28 NOTE — ED Triage Notes (Signed)
Pt reports that she stepped on a nail this morning on her right foot. Last tetanus shot was in 2015.

## 2023-05-28 NOTE — ED Provider Notes (Signed)
[facility]    CSN: [id number] Arrival date & time: 05/28/23  0906      History   Chief Complaint Chief Complaint  Patient presents with   Foot Pain    HPI Cynthia Gamble is a 22 y.o. female.   Patient here today with mother for evaluation of puncture wound to her left foot that occurred earlier this morning.  She states she stepped on a nail that went through her shoe and into her foot.  She has not had any active bleeding.  She is unsure of the nail proceeded in her foot but states she does have some pain with weightbearing.  She denies any numbness or tingling.  Last tetanus vaccine was in 2015.  She denies any chance of pregnancy.  The history is provided by the patient.  Foot Pain Pertinent negatives include no abdominal pain.    Past Medical History:  Diagnosis Date   Diarrhea    Von Willebrand disease type IA [facility])     Patient Active Problem List   Diagnosis Date Noted   Diarrhea, unspecified 05/28/2023   Irritable bowel syndrome with diarrhea 05/28/2023   Von Willebrand's disease (HCC) 05/28/2023   Viral pharyngitis 05/28/2023   URI (upper respiratory infection) 05/28/2023   Skin tag of ear 05/28/2023   Exercise counseling 05/28/2023   Encounter for dietary counseling and surveillance 05/28/2023   Health education/counseling 05/28/2023   Routine infant or child health check 05/28/2023   Need for immunization against influenza 05/28/2023   Migraine syndrome 05/28/2023   Left foot pain 05/28/2023   Lead exposure 05/28/2023   Influenza vaccine administered 05/28/2023   Healthy child on routine physical examination 05/28/2023   Gastroenteritis 05/28/2023   Functional diarrhea 05/28/2023   Frequent headaches 05/28/2023   Focal motor deficit 05/28/2023   Exposure to the flu 05/28/2023   Diarrhea of presumed infectious origin 05/28/2023   Conjunctivitis 05/28/2023   Chronic cough 05/28/2023   Bruising 05/28/2023   Bronchitis 05/28/2023    Anemia 05/28/2023   Acute pharyngitis 05/28/2023   Acute bacterial sinusitis 05/28/2023   Stomach pain 05/28/2023   Abdominal pain, acute 05/28/2023   Bleeding disorder (HCC) 05/28/2023   Childhood obesity 05/28/2023   BMI 85th to less than 95th percentile with athletic build, pediatric 05/28/2023   Symptomatic mammary hypertrophy 02/19/2022   Back pain 02/19/2022   Neck pain 02/19/2022   Vitamin D deficiency 06/22/2019   Pes planus 06/22/2019   Tendonitis 06/22/2019   Short stature 09/01/2013   Von Willebrand disease, type 1a (HCC) 01/18/2013    Past Surgical History:  Procedure Laterality Date   BREAST REDUCTION SURGERY Bilateral 06/12/2022   Procedure: Bilateral breast reduction with liposuction;  Surgeon: Peggye Form, DO;  Location: [facility];  Service: Plastics;  Laterality: Bilateral;   SKIN TAG REMOVAL     WISDOM TOOTH EXTRACTION      OB History   No obstetric history on file.      Home Medications    Prior to Admission medications   Medication Sig Start Date End Date Taking? Authorizing Provider  levofloxacin (LEVAQUIN) 500 MG tablet Take 1 tablet (500 mg total) by mouth daily. 05/28/23  Yes Tomi Bamberger, PA-C  Norethindrone Acetate-Ethinyl Estrad-FE (JUNEL FE 24) 1-20 MG-MCG(24) tablet Take 1 tablet by mouth daily. 10/22/22  Yes [provider]  cholecalciferol (VITAMIN D3) 25 MCG (1000 UNIT) tablet Take by mouth. Patient not taking: Reported on 05/28/2023 06/22/19   [provider]  dicyclomine (BENTYL) 20 MG tablet Take 1 tablet (20 mg total) by mouth 2 (two) times daily. Patient not taking: Reported on 05/28/2023 11/20/22   Redwine, Madison A, PA-C  loperamide (IMODIUM) 2 MG capsule Take 1 capsule (2 mg total) by mouth 4 (four) times daily as needed for diarrhea or loose stools. Patient not taking: Reported on 05/28/2023 11/20/22   Redwine, Madison A, PA-C  ondansetron (ZOFRAN-ODT) 4 MG disintegrating tablet Take 1  tablet (4 mg total) by mouth every 8 (eight) hours as needed for nausea or vomiting. Patient not taking: Reported on 05/28/2023 11/20/22   Redwine, Madison A, PA-C    Family History Family History  Problem Relation Age of Onset   Irritable bowel syndrome Maternal Grandmother    Lactose intolerance Maternal Grandmother    Hypertension Maternal Grandmother    Short stature Maternal Grandmother        4'10   Migraines Maternal Grandmother    Depression Maternal Grandmother    Anxiety disorder Maternal Grandmother    Cancer Paternal Grandmother    Migraines Mother    Depression Mother    Anxiety disorder Mother    Bipolar disorder Mother    Short stature Maternal Aunt        great aunt- 4'9"   Migraines Maternal Aunt    Depression Maternal Aunt    Anxiety disorder Maternal Aunt    Autism Cousin    Seizures Neg Hx    Schizophrenia Neg Hx    ADD / ADHD Neg Hx     Social History Social History   Tobacco Use   Smoking status: Never   Smokeless tobacco: Never  Vaping Use   Vaping status: Never Used  Substance Use Topics   Alcohol use: Never   Drug use: Never     Allergies   Patient has no known allergies.   Review of Systems Review of Systems  Constitutional:  Negative for chills and fever.  Eyes:  Negative for discharge and redness.  Gastrointestinal:  Negative for abdominal pain, nausea and vomiting.  Skin:  Negative for color change and wound.  Neurological:  Negative for numbness.     Physical Exam Triage Vital Signs ED Triage Vitals [05/28/23 0924]  Encounter Vitals Group     BP 125/81     Systolic BP Percentile      Diastolic BP Percentile      Pulse Rate 78     Resp 16     Temp 98.4 F (36.9 C)     Temp Source Oral     SpO2 96 %     Weight      Height      Head Circumference      Peak Flow      Pain Score 7     Pain Loc      Pain Education      Exclude from Growth Chart    No data found.  Updated Vital Signs BP 125/81 (BP Location: Left  Arm)   Pulse 78   Temp 98.4 F (36.9 C) (Oral)   Resp 16   LMP  (LMP Unknown)   SpO2 96%   Visual Acuity Right Eye Distance:   Left Eye Distance:   Bilateral Distance:    Right Eye Near:   Left Eye Near:    Bilateral Near:     Physical Exam Vitals and nursing note reviewed.  Constitutional:      General: She is not in acute distress.  Appearance: Normal appearance. She is not ill-appearing.  HENT:     Head: Normocephalic and atraumatic.  Eyes:     Conjunctiva/sclera: Conjunctivae normal.  Cardiovascular:     Rate and Rhythm: Normal rate.  Pulmonary:     Effort: Pulmonary effort is normal. No respiratory distress.  Skin:    Comments: Left sole of foot without apparent wound, erythema, swelling  Neurological:     Mental Status: She is alert.  Psychiatric:        Mood and Affect: Mood normal.        Behavior: Behavior normal.        Thought Content: Thought content normal.      UC Treatments / Results  Labs (all labs ordered are listed, but only abnormal results are displayed) Labs Reviewed - No data to display  EKG   Radiology No results found.  Procedures Procedures (including critical care time)  Medications Ordered in UC Medications  Tdap (BOOSTRIX) injection 0.5 mL (has no administration in time range)    Initial Impression / Assessment and Plan / UC Course  I have reviewed the triage vital signs and the nursing notes.  Pertinent labs & imaging results that were available during my care of the patient were reviewed by me and considered in my medical decision making (see chart for details).    No wounds appreciated on exam however given reported mechanism of injury we will treat to cover possible infection with Levaquin and tetanus vaccine administered in office.  Encouraged follow-up if no gradual improvement or with any further concerns.  Final Clinical Impressions(s) / UC Diagnoses   Final diagnoses:  Puncture wound of right foot, initial  encounter   Discharge Instructions   None    ED Prescriptions     Medication Sig Dispense Auth. Provider   levofloxacin (LEVAQUIN) 500 MG tablet Take 1 tablet (500 mg total) by mouth daily. 7 tablet Tomi Bamberger, PA-C      PDMP not reviewed this encounter.   Tomi Bamberger, PA-C 05/28/23 [id number]

## 2023-06-04 ENCOUNTER — Encounter: Payer: Self-pay | Admitting: Gastroenterology

## 2023-06-04 ENCOUNTER — Other Ambulatory Visit: Payer: 59

## 2023-06-04 ENCOUNTER — Ambulatory Visit: Payer: 59 | Admitting: Gastroenterology

## 2023-06-04 VITALS — BP 90/60 | HR 88 | Ht <= 58 in | Wt 156.5 lb

## 2023-06-04 DIAGNOSIS — K625 Hemorrhage of anus and rectum: Secondary | ICD-10-CM

## 2023-06-04 DIAGNOSIS — K580 Irritable bowel syndrome with diarrhea: Secondary | ICD-10-CM

## 2023-06-04 DIAGNOSIS — R109 Unspecified abdominal pain: Secondary | ICD-10-CM

## 2023-06-04 DIAGNOSIS — R197 Diarrhea, unspecified: Secondary | ICD-10-CM | POA: Diagnosis not present

## 2023-06-04 DIAGNOSIS — K529 Noninfective gastroenteritis and colitis, unspecified: Secondary | ICD-10-CM

## 2023-06-04 MED ORDER — DICYCLOMINE HCL 20 MG PO TABS: 20.0000 mg | ORAL_TABLET | Freq: Four times a day (QID) | ORAL | 3 refills | Status: AC | PRN

## 2023-06-04 NOTE — Patient Instructions (Signed)
Your provider has requested that you go to the basement level for lab work before leaving today. Press "B" on the elevator. The lab is located at the first door on the left as you exit the elevator.  We have sent the following medications to your pharmacy for you to pick up at your convenience: Bentyl 20 mg every 6  hours as needed.  _______________________________________________________  If your blood pressure at your visit was 140/90 or greater, please contact your primary care physician to follow up on this.  _______________________________________________________  If you are age 20 or older, your body mass index should be between 23-30. Your Body mass index is 33.87 kg/m. If this is out of the aforementioned range listed, please consider follow up with your Primary Care Provider.  If you are age 77 or younger, your body mass index should be between 19-25. Your Body mass index is 33.87 kg/m. If this is out of the aformentioned range listed, please consider follow up with your Primary Care Provider.   ________________________________________________________  The [facility] providers would like to encourage you to use [facility] to communicate with providers for non-urgent requests or questions.  Due to long hold times on the telephone, sending your provider a message by [facility] may be a faster and more efficient way to get a response.  Please allow 48 business hours for a response.  Please remember that this is for non-urgent requests.   It was a pleasure to see you today!  Thank you for trusting me with your gastrointestinal care!    Scott E.Tomasa Rand, MD

## 2023-06-04 NOTE — Progress Notes (Signed)
Discussed the use of AI scribe software for clinical note transcription with the patient, who gave verbal consent to proceed.  HPI : Cynthia Gamble is a 22 y.o. female who is referred to [place] by Redwine, Madison A, PA-C for further evaluation of abdominal pain and diarrhea. The patient reported to the emergency room in May of this year after experiencing 3 weeks of persistent abdominal pain and diarrhea, also with fevers (102 in the ER).  A CT scan at that visit showed diffuse colon wall thickening suggestive of pancolitis.  She was prescribed Augmentin and given loperamide and Bentyl at discharge.  She states that her symptoms lasted another 2 weeks before improving. Since then, she has continued to experience bouts of abdominal pain and diarrhea, but has mostly been having normal bowel habits without abdominal pain.  Typically, these bouts of abdominal pain will last several hours, and she will have 1-2 loose stools with urgency accompanying the abdominal pain.  She estimates having an episode about once a week.    The patient also reports episodes of rectal bleeding, with the last episode occurring a few months prior.  She denied having any blood in her stool during the 5 weeks of acute symptoms.  The blood was bright red, noted in the toilet and on the toilet paper, but was not associated with any pain during defecation.  She estimates seeing blood only on a few occasions.  The patient denies any weight changes and is currently only on birth control medication. The patient follows a regular diet and denies any family history of inflammatory bowel disease, but reports a family history of irritable bowel syndrome.       CT Abdomen/pelvis May 2024 IMPRESSION: 1. Diffuse colonic wall thickening, also involving the terminal ileum, concerning for infectious or inflammatory enterocolitis.   Past Medical History:  Diagnosis Date   Diarrhea    Von Willebrand disease type IA [facility])       Past Surgical History:  Procedure Laterality Date   BREAST REDUCTION SURGERY Bilateral 06/12/2022   Procedure: Bilateral breast reduction with liposuction;  Surgeon: Peggye Form, DO;  Location: [facility];  Service: Plastics;  Laterality: Bilateral;   SKIN TAG REMOVAL     WISDOM TOOTH EXTRACTION     Family History  Problem Relation Age of Onset   Irritable bowel syndrome Maternal Grandmother    Lactose intolerance Maternal Grandmother    Hypertension Maternal Grandmother    Short stature Maternal Grandmother        4'10   Migraines Maternal Grandmother    Depression Maternal Grandmother    Anxiety disorder Maternal Grandmother    Cancer Paternal Grandmother    Migraines Mother    Depression Mother    Anxiety disorder Mother    Bipolar disorder Mother    Short stature Maternal Aunt        great aunt- 4'9"   Migraines Maternal Aunt    Depression Maternal Aunt    Anxiety disorder Maternal Aunt    Autism Cousin    Seizures Neg Hx    Schizophrenia Neg Hx    ADD / ADHD Neg Hx    Social History   Tobacco Use   Smoking status: Never   Smokeless tobacco: Never  Vaping Use   Vaping status: Never Used  Substance Use Topics   Alcohol use: Never   Drug use: Never  Consulting civil engineer, majoring in Investment banker, 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     No current facility-administered medications for this visit.   No Known Allergies   Review of Systems: All systems reviewed and negative except where noted in HPI.    No results found.  Physical Exam: BP 90/60   Pulse 88   Ht 4\' 9"  (1.448 m)   Wt 156 lb 8 oz (71 kg)   LMP  (LMP Unknown)   SpO2 99%   BMI 33.87 kg/m  Constitutional: Pleasant,well-developed, African-American female in no acute distress.  Accompanied by  grandmother HEENT: Normocephalic and atraumatic. Conjunctivae are normal. No scleral icterus. Neck supple.  Cardiovascular: Normal rate, regular rhythm.  Pulmonary/chest: Effort normal and breath sounds normal. No wheezing, rales or rhonchi. Abdominal: Soft, nondistended, mild tenderness to palpation in the left lower quadrant, without rigidity or guarding. Bowel sounds active throughout. There are no masses palpable. No hepatomegaly. Extremities: no edema Lymphadenopathy: No cervical adenopathy noted. Neurological: Alert and oriented to person place and time. Skin: Skin is warm and dry. No rashes noted. Psychiatric: Normal mood and affect. Behavior is normal.  CBC    Component Value Date/Time   WBC 5.8 11/20/2022 1127   RBC 4.32 11/20/2022 1127   HGB 12.0 11/20/2022 1127   HCT 37.1 11/20/2022 1127   PLT 284 11/20/2022 1127   MCV 85.9 11/20/2022 1127   MCH 27.8 11/20/2022 1127   MCHC 32.3 11/20/2022 1127   RDW 13.6 11/20/2022 1127    CMP     Component Value Date/Time   NA 135 11/20/2022 1127   K 2.9 (L) 11/20/2022 1127   CL 103 11/20/2022 1127   CO2 21 (L) 11/20/2022 1127   GLUCOSE 89 11/20/2022 1127   BUN <5 (L) 11/20/2022 1127   CREATININE 0.70 11/20/2022 1127   CALCIUM 8.7 (L) 11/20/2022 1127   PROT 7.2 11/20/2022 1127   ALBUMIN 3.6 11/20/2022 1127   AST 19 11/20/2022 1127   ALT 24 11/20/2022 1127   ALKPHOS 77 11/20/2022 1127   BILITOT 0.6 11/20/2022 1127   GFRNONAA >60 11/20/2022 1127       Latest Ref Rng & Units 11/20/2022   11:27 AM  CBC EXTENDED  WBC 4.0 - 10.5 K/uL 5.8   RBC 3.87 - 5.11 MIL/uL 4.32   Hemoglobin 12.0 - 15.0 g/dL 21.3   HCT 08.6 - 57.8 % 37.1   Platelets 150 - 400 K/uL 284       ASSESSMENT AND PLAN:  22 year old female who experienced 5 weeks of persistent abdominal pain and diarrhea with fevers, with subsequent persistent intermittent abdominal pain and diarrhea.  The history is most consistent with an infectious colitis followed by  postinfectious IBS, however the 5-week duration of acute symptoms is atypical for most infections.  Inflammatory bowel disease also possible, although would expect patient's symptoms do not be so intermittent for the past several months.  Post-Infectious Irritable Bowel Syndrome (IBS) Intermittent abdominal pain and diarrhea since May following presumed infectious colitis. Symptoms include weekly abdominal pain lasting hours, associated with bowel movements. Stool consistency varies from diarrhea to semi-solid. No significant weight loss. Family history of IBS. Differential diagnosis includes post-infectious IBS and new onset inflammatory bowel disease (IBD). Post-infectious IBS may result from changes in gut flora due to infection or antibiotics. Bentyl (dicyclomine) can reduce intestinal spasms and alleviate abdominal pain.  We discussed potentially performing a colonoscopy to definitively exclude IBD.  However,  given the fairly low likelihood of inflammatory bowel disease, I think a fecal calprotectin would suffice.  She is aware that if the calprotectin is abnormal, we will need to proceed with a colonoscopy. - Order stool test for calprotectin to rule out IBD - Prescribe Bentyl (dicyclomine) for abdominal pain as needed - Recommend dietary fiber intake through fruits, vegetables, or a daily fiber supplement like Metamucil  Rectal Bleeding Intermittent bright red blood in stool noted a few times since May, associated with abdominal pain and diarrhea. No blood seen in the last few months. Likely due to internal hemorrhoids exacerbated by diarrhea. Low likelihood of colon cancer given age and intermittent symptoms.  Low likelihood of bleeding from colitis/proctitis given infrequent occurrence.  Internal hemorrhoids can bleed due to diarrhea or constipation. If calprotectin abnormal, a colonoscopy would be necessary to rule out IBD or other pathology. - Monitor for recurrence of rectal bleeding  Shalona Harbour  E. Tomasa Rand, MD [facility]  I spent a total of 40 minutes reviewing the patient's medical record, interviewing and examining the patient, discussing her diagnosis and management of her condition going forward, and documenting in the medical record     Redwine, Madison A, PA-C
